# Patient Record
Sex: Male | Born: 1975 | Race: Black or African American | Hispanic: No | Marital: Married | Smoking: Never smoker
Health system: Southern US, Community
[De-identification: ages and names within clinical notes are randomized; demographics above are authoritative.]

## PROBLEM LIST (undated history)

## (undated) DIAGNOSIS — J309 Allergic rhinitis, unspecified: Secondary | ICD-10-CM

## (undated) DIAGNOSIS — M1990 Unspecified osteoarthritis, unspecified site: Secondary | ICD-10-CM

## (undated) DIAGNOSIS — E785 Hyperlipidemia, unspecified: Secondary | ICD-10-CM

## (undated) DIAGNOSIS — Z8659 Personal history of other mental and behavioral disorders: Secondary | ICD-10-CM

## (undated) DIAGNOSIS — M5412 Radiculopathy, cervical region: Secondary | ICD-10-CM

## (undated) DIAGNOSIS — G935 Compression of brain: Secondary | ICD-10-CM

## (undated) DIAGNOSIS — R002 Palpitations: Secondary | ICD-10-CM

## (undated) HISTORY — PX: TONSILLECTOMY AND ADENOIDECTOMY: SUR1326

## (undated) HISTORY — DX: Radiculopathy, cervical region: M54.12

## (undated) HISTORY — DX: Personal history of other mental and behavioral disorders: Z86.59

## (undated) HISTORY — DX: Compression of brain: G93.5

## (undated) HISTORY — DX: Hyperlipidemia, unspecified: E78.5

## (undated) HISTORY — DX: Palpitations: R00.2

## (undated) HISTORY — DX: Allergic rhinitis, unspecified: J30.9

## (undated) HISTORY — DX: Unspecified osteoarthritis, unspecified site: M19.90

---

## 1999-01-10 ENCOUNTER — Emergency Department (HOSPITAL_COMMUNITY): Admission: EM | Admit: 1999-01-10 | Discharge: 1999-01-10 | Payer: Self-pay | Admitting: Emergency Medicine

## 2005-11-16 DIAGNOSIS — R002 Palpitations: Secondary | ICD-10-CM

## 2005-11-16 HISTORY — DX: Palpitations: R00.2

## 2008-11-11 ENCOUNTER — Emergency Department (HOSPITAL_COMMUNITY): Admission: EM | Admit: 2008-11-11 | Discharge: 2008-11-11 | Payer: Self-pay | Admitting: Emergency Medicine

## 2008-12-19 ENCOUNTER — Encounter: Payer: Self-pay | Admitting: Internal Medicine

## 2008-12-26 ENCOUNTER — Encounter: Payer: Self-pay | Admitting: Internal Medicine

## 2009-01-18 ENCOUNTER — Encounter: Payer: Self-pay | Admitting: Internal Medicine

## 2009-02-20 ENCOUNTER — Ambulatory Visit: Payer: Self-pay | Admitting: Internal Medicine

## 2009-04-18 ENCOUNTER — Ambulatory Visit: Payer: Self-pay | Admitting: Internal Medicine

## 2009-04-18 DIAGNOSIS — F411 Generalized anxiety disorder: Secondary | ICD-10-CM | POA: Insufficient documentation

## 2009-05-01 ENCOUNTER — Encounter: Payer: Self-pay | Admitting: Internal Medicine

## 2009-05-21 ENCOUNTER — Telehealth (INDEPENDENT_AMBULATORY_CARE_PROVIDER_SITE_OTHER): Payer: Self-pay | Admitting: *Deleted

## 2009-05-31 ENCOUNTER — Telehealth: Payer: Self-pay | Admitting: Internal Medicine

## 2009-06-26 ENCOUNTER — Ambulatory Visit: Payer: Self-pay | Admitting: Internal Medicine

## 2009-06-26 DIAGNOSIS — R079 Chest pain, unspecified: Secondary | ICD-10-CM | POA: Insufficient documentation

## 2009-07-01 ENCOUNTER — Telehealth (INDEPENDENT_AMBULATORY_CARE_PROVIDER_SITE_OTHER): Payer: Self-pay | Admitting: *Deleted

## 2010-02-10 ENCOUNTER — Ambulatory Visit: Payer: Self-pay | Admitting: Internal Medicine

## 2010-02-10 DIAGNOSIS — J069 Acute upper respiratory infection, unspecified: Secondary | ICD-10-CM | POA: Insufficient documentation

## 2010-02-10 DIAGNOSIS — J309 Allergic rhinitis, unspecified: Secondary | ICD-10-CM | POA: Insufficient documentation

## 2010-02-10 DIAGNOSIS — J029 Acute pharyngitis, unspecified: Secondary | ICD-10-CM

## 2010-02-10 LAB — CONVERTED CEMR LAB: Rapid Strep: NEGATIVE

## 2010-12-16 NOTE — Assessment & Plan Note (Signed)
Summary: SORE THROAT/RH.......Marland Kitchen   Vital Signs:  Patient profile:   35 year old male Height:      70.5 inches Weight:      227 pounds BMI:     32.23 Temp:     99.7 degrees F BP sitting:   120 / 80  Vitals Entered By: Shary Decamp (February 10, 2010 3:34 PM) CC: ST x few days Comments pt has ? about flonase vs saline nasal spray Shary Decamp  February 10, 2010 3:42 PM    History of Present Illness: sick x 3 days  ST, weak using OTCs   also wonders if he needs to use Flonase long-term, this was prescribed by ENT for constant nasal congestion.  Seems to be working. He used to use afrin several times a day and he is not doing that at this point   Allergies: No Known Drug Allergies  Past History:  Past Medical History: cold sores aprox 2007: had palpitations, saw cards, got a ECHO, told he was ok Anxiety Allergic rhinitis  Past Surgical History: Reviewed history from 02/20/2009 and no changes required. surgery for adenoids  Review of Systems General:  low grade fever today . ENT:  some sinus congestion. Resp:  no chest congestion, some cough some sputum x 1 last night . GI:  some nausea yesterday, no V D.  Physical Exam  General:  alert and well-developed.   Head:  face symmetric, nontender to palpation Ears:  R ear normal and L ear normal.   Nose:  congested Mouth:  no redness or discharge Lungs:  clear to auscultation bilaterally Heart:  normal rate, regular rhythm, and no murmur.     Impression & Recommendations:  Problem # 1:  URI (ICD-465.9) see instructions  Problem # 2:  ALLERGIC RHINITIS (ICD-477.9) history of afrin  abuse saw ENT, was  prescribe Flonase, apparently working, wonders if he needs to take that long term Yes:  recommend Flonase daily for several months if not indefinitely His updated medication list for this problem includes:    Flonase 50 Mcg/act Susp (Fluticasone propionate) .Marland Kitchen... 2 sprays each nostril once daily  Problem # 3:  also  difficulty loosing weight counseled about diet, Weight Watchers?  Complete Medication List: 1)  Flonase 50 Mcg/act Susp (Fluticasone propionate) .... 2 sprays each nostril once daily 2)  Valtrex 500 Mg Tabs (Valacyclovir hcl) .... As directed for cold sores 3)  Alprazolam 0.25 Mg Tabs (Alprazolam) .Marland Kitchen.. 1po two times a day as needed  Other Orders: Rapid Strep (16109)  Patient Instructions: 1)  Get plenty of rest, drink lots of clear liquids, and use Tylenol  2)  Robitussin-DM 3)  Sudafed 30 mg (behind the counter)one by mouth every   6 hours as needed for congestion 4)  call if not better in a few days  Prescriptions: FLONASE 50 MCG/ACT SUSP (FLUTICASONE PROPIONATE) 2 sprays each nostril once daily  #1 x 6   Entered and Authorized by:   Nolon Rod. Paz MD   Signed by:   Nolon Rod. Paz MD on 02/10/2010   Method used:   Electronically to        [facility] [address] (retail)       [address]       [place], [place]  [id number]       Ph: [phone]       Fax: [phone]   RxID:   [id number]   Laboratory Results    Other Tests  Rapid  Strep: negative  Kit Test Internal QC: Positive   (Normal Range: Negative)

## 2011-04-06 ENCOUNTER — Other Ambulatory Visit: Payer: Self-pay | Admitting: Internal Medicine

## 2011-08-10 ENCOUNTER — Other Ambulatory Visit: Payer: Self-pay | Admitting: Internal Medicine

## 2011-08-10 NOTE — Telephone Encounter (Signed)
Done

## 2011-08-21 LAB — CBC
HCT: 46.7 % (ref 39.0–52.0)
Hemoglobin: 15.6 g/dL (ref 13.0–17.0)
RBC: 4.97 MIL/uL (ref 4.22–5.81)
RDW: 13.3 % (ref 11.5–15.5)
WBC: 4.1 10*3/uL (ref 4.0–10.5)

## 2011-08-21 LAB — URINALYSIS, ROUTINE W REFLEX MICROSCOPIC
Glucose, UA: NEGATIVE mg/dL
Hgb urine dipstick: NEGATIVE
Ketones, ur: NEGATIVE mg/dL
pH: 7 (ref 5.0–8.0)

## 2011-08-21 LAB — POCT I-STAT, CHEM 8
BUN: 14 mg/dL (ref 6–23)
Calcium, Ion: 1.15 mmol/L (ref 1.12–1.32)
Chloride: 104 mEq/L (ref 96–112)
Creatinine, Ser: 1.2 mg/dL (ref 0.4–1.5)
TCO2: 25 mmol/L (ref 0–100)

## 2011-08-21 LAB — DIFFERENTIAL
Basophils Absolute: 0 10*3/uL (ref 0.0–0.1)
Eosinophils Relative: 2 % (ref 0–5)
Lymphocytes Relative: 23 % (ref 12–46)
Lymphs Abs: 0.9 10*3/uL (ref 0.7–4.0)
Monocytes Absolute: 0.4 10*3/uL (ref 0.1–1.0)
Neutro Abs: 2.6 10*3/uL (ref 1.7–7.7)

## 2011-08-21 LAB — RAPID URINE DRUG SCREEN, HOSP PERFORMED
Amphetamines: NOT DETECTED
Barbiturates: NOT DETECTED
Benzodiazepines: NOT DETECTED
Cocaine: NOT DETECTED

## 2011-11-06 ENCOUNTER — Ambulatory Visit (INDEPENDENT_AMBULATORY_CARE_PROVIDER_SITE_OTHER): Payer: Managed Care, Other (non HMO)

## 2011-11-06 DIAGNOSIS — M659 Unspecified synovitis and tenosynovitis, unspecified site: Secondary | ICD-10-CM

## 2011-11-06 DIAGNOSIS — M79609 Pain in unspecified limb: Secondary | ICD-10-CM

## 2011-12-01 ENCOUNTER — Other Ambulatory Visit: Payer: Self-pay | Admitting: Internal Medicine

## 2011-12-01 NOTE — Telephone Encounter (Signed)
Done

## 2012-02-13 ENCOUNTER — Ambulatory Visit: Payer: Managed Care, Other (non HMO)

## 2012-02-13 ENCOUNTER — Ambulatory Visit (INDEPENDENT_AMBULATORY_CARE_PROVIDER_SITE_OTHER): Payer: Managed Care, Other (non HMO) | Admitting: Family Medicine

## 2012-02-13 DIAGNOSIS — M940 Chondrocostal junction syndrome [Tietze]: Secondary | ICD-10-CM

## 2012-02-13 DIAGNOSIS — R001 Bradycardia, unspecified: Secondary | ICD-10-CM

## 2012-02-13 DIAGNOSIS — S20219A Contusion of unspecified front wall of thorax, initial encounter: Secondary | ICD-10-CM

## 2012-02-13 NOTE — Progress Notes (Signed)
[facility]:  Office Visit  Chief Complaint:  Chief Complaint  Patient presents with  . Chest Injury    sore in upper left chest area  since wednesday    HPI: Brandon Tyler is a 36 y.o. male who complains of  3 day history of right sided chest pain after trauma. He was playing in a soccer league, fell and hit his chest on the ground. Denies any pleuritic CP, just stabbing pain with palpation. No other sxs, SOB, palpitations, N/V, abd pain, numbness, weakness tingling. Athletic but not avid runner. Workout 4 x a week. Denies history of  premature MI or arryhthymias in family.   History reviewed. No pertinent past medical history. Past Surgical History  Procedure Date  . Tonsillectomy and adenoidectomy    History   Social History  . Marital Status: Married    Spouse Name: N/A    Number of Children: N/A  . Years of Education: N/A   Social History Main Topics  . Smoking status: Never Smoker   . Smokeless tobacco: None  . Alcohol Use: Yes  . Drug Use: No  . Sexually Active: None   Other Topics Concern  . None   Social History Narrative  . None   Family History  Problem Relation Age of Onset  . Hyperlipidemia Maternal Grandmother    No Known Allergies Prior to Admission medications   Medication Sig Start Date End Date Taking? Authorizing Provider  meloxicam (MOBIC) 15 MG tablet Take 15 mg by mouth daily.   Yes Historical Provider, MD  fluticasone Aleda Grana) 50 MCG/ACT nasal spray place 2 sprays into each nostril once daily 12/01/11   Wanda Plump, MD     ROS: The patient denies fevers, chills, night sweats, unintentional weight loss palpitations, wheezing, dyspnea on exertion, nausea, vomiting, abdominal pain, dysuria, hematuria, melena, numbness, weakness, or tingling. + CP  All other systems have been reviewed and were otherwise negative with the exception of those mentioned in the HPI and as above.    PHYSICAL EXAM: Filed Vitals:   02/13/12 1002    BP: 101/66  Pulse: 47  Temp: 97.6 F (36.4 C)  Resp: 16   Filed Vitals:   02/13/12 1002  Height: 5\' 10"  (1.778 m)  Weight: 219 lb 12.8 oz (99.701 kg)   Body mass index is 31.54 kg/(m^2).  General: Alert, no acute distress HEENT:  Normocephalic, atraumatic, oropharynx patent.  Cardiovascular: Bradycardia,  Regular  rhythm, no rubs murmurs or gallops.  No Carotid bruits, radial pulse intact. No pedal edema.  Respiratory: Clear to auscultation bilaterally.  No wheezes, rales, or rhonchi.  No cyanosis, no use of accessory musculature GI: No organomegaly, abdomen is soft and non-tender, positive bowel sounds.  No masses. Skin: No rashes. Neurologic: Facial musculature symmetric. Psychiatric: Patient is appropriate throughout our interaction. Lymphatic: No cervical lymphadenopathy Musculoskeletal: Gait intact.   LABS: Results for orders placed in visit on 02/10/10  CONVERTED [facility] LAB      Component Value Range   Rapid Strep negative       EKG/XRAY:   Primary read interpreted by Dr. Conley Rolls at [facility]. CXR- No fx/no dislocation EKG sinus brady 46 bpm, no ST changes.    ASSESSMENT/PLAN: Encounter Diagnoses  Name Primary?  . Chest pain Yes  . Bradycardia    Chest wall contusion and costochondritis-take Meloxicam as directed Bradycardia with associated lightheadedness/dizziness during exercise-EKG BPM 46, in office pulse 44,47 respectively. From our records he has alwys had a  low pulse in the mid 50s however it has never been below 50 or in mid 40s. Refer to cardiology.     Cable Fearn PHUONG, DO 02/13/2012 10:49 AM

## 2012-06-13 ENCOUNTER — Other Ambulatory Visit: Payer: Self-pay | Admitting: Internal Medicine

## 2012-06-14 NOTE — Telephone Encounter (Signed)
Denied, haven't seen the patient in more than 3 years

## 2012-06-14 NOTE — Telephone Encounter (Signed)
Done

## 2012-06-14 NOTE — Telephone Encounter (Signed)
Pt has not been seen within a year. OK to refill? 

## 2012-06-20 NOTE — Telephone Encounter (Signed)
Pt left vm on triage line to advise that he was informed by pharmacy that he needs an apt however pt stated on vm that he was calling in reference to that per confused, please advise

## 2012-06-20 NOTE — Telephone Encounter (Signed)
Discussed with pt

## 2012-06-23 ENCOUNTER — Other Ambulatory Visit: Payer: Self-pay | Admitting: Internal Medicine

## 2012-06-23 NOTE — Telephone Encounter (Signed)
Pt has a future appt.

## 2012-06-23 NOTE — Telephone Encounter (Signed)
Pt called and made CPE appointment for first available in October. He is requesting rx for his flonase.

## 2012-07-21 ENCOUNTER — Encounter: Payer: Self-pay | Admitting: Internal Medicine

## 2012-08-29 ENCOUNTER — Encounter: Payer: Self-pay | Admitting: Internal Medicine

## 2012-08-30 ENCOUNTER — Encounter: Payer: Self-pay | Admitting: Internal Medicine

## 2012-11-25 ENCOUNTER — Encounter: Payer: Self-pay | Admitting: Internal Medicine

## 2012-12-02 ENCOUNTER — Ambulatory Visit: Payer: Managed Care, Other (non HMO) | Admitting: Family Medicine

## 2012-12-02 VITALS — BP 109/69 | HR 56 | Temp 98.0°F | Resp 16 | Ht 70.0 in | Wt 220.0 lb

## 2012-12-02 DIAGNOSIS — R109 Unspecified abdominal pain: Secondary | ICD-10-CM

## 2012-12-02 DIAGNOSIS — K219 Gastro-esophageal reflux disease without esophagitis: Secondary | ICD-10-CM

## 2012-12-02 LAB — COMPREHENSIVE METABOLIC PANEL
ALT: 24 U/L (ref 0–53)
Albumin: 4.6 g/dL (ref 3.5–5.2)
CO2: 25 mEq/L (ref 19–32)
Calcium: 9.5 mg/dL (ref 8.4–10.5)
Chloride: 103 mEq/L (ref 96–112)
Potassium: 4.5 mEq/L (ref 3.5–5.3)
Sodium: 138 mEq/L (ref 135–145)
Total Bilirubin: 0.6 mg/dL (ref 0.3–1.2)
Total Protein: 7.2 g/dL (ref 6.0–8.3)

## 2012-12-02 LAB — AMYLASE: Amylase: 76 U/L (ref 0–105)

## 2012-12-02 LAB — POCT CBC
HCT, POC: 49.9 % (ref 43.5–53.7)
Hemoglobin: 15.8 g/dL (ref 14.1–18.1)
MPV: 9 fL (ref 0–99.8)
POC Granulocyte: 2.7 (ref 2–6.9)
POC MID %: 8.2 %M (ref 0–12)
RBC: 5.28 M/uL (ref 4.69–6.13)

## 2012-12-02 MED ORDER — GI COCKTAIL ~~LOC~~: 30.0000 mL | Freq: Once | ORAL | Status: DC

## 2012-12-02 MED ORDER — SUCRALFATE 1 G PO TABS: 1.0000 g | ORAL_TABLET | Freq: Four times a day (QID) | ORAL | Status: DC

## 2012-12-02 NOTE — Progress Notes (Signed)
[facility] [address], [place] [address] [phone]  Date:  12/02/2012   Name:  Brandon Tyler   DOB:  [birth year]   MRN:  [id number]  PCP:  Willow Ora, MD    Chief Complaint: Abdominal Pain   History of Present Illness:  Brandon Tyler is a 37 y.o. very pleasant male patient who presents with the following:  He has noted a history of abdominal pain with spicy foods- however this usually resolves with time.  His pain pattern is different this time- he awoke with abdomianl pain on Monday, and then had diarrhea for a day.  His BMs are now back to normal.  The pain has come and gone since then- today is Friday.  His worst pain is in the epigastric area.    He has not noted any nausea or vomiting, no blood in his stools.   The pain is "like a burning."  The pain is not as severe now as it has been but it is still present.    He has had trouble with spicy foods since he was a child.  They tend to cause GI upset and diarrhea.   He did eat spicy foods the day prior to onset of this episode He has tried a milk of magnesia OTC treatment, and is drinking milk as well- no other treatments attempted.    He does not use ibuprofen or other NSAIDs regulaly He is generally healthy He had a T and A in middle school. Otherwise no history of surgery  He is married,  A non- smoker  Eating does not generally make him worse- only worse if he eats spicy foods  Patient Active Problem List  Diagnosis  . ANXIETY  . SORE THROAT  . URI  . ALLERGIC RHINITIS  . CHEST PAIN    History reviewed. No pertinent past medical history.  Past Surgical History  Procedure Date  . Tonsillectomy and adenoidectomy     History  Substance Use Topics  . Smoking status: Never Smoker   . Smokeless tobacco: Not on file  . Alcohol Use: Yes    Family History  Problem Relation Age of Onset  . Hyperlipidemia Maternal Grandmother     No Known Allergies  Medication list has been  reviewed and updated.  Current Outpatient Prescriptions on File Prior to Visit  Medication Sig Dispense Refill  . fluticasone (FLONASE) 50 MCG/ACT nasal spray place 2 sprays into each nostril once daily  16 g  0  . meloxicam (MOBIC) 15 MG tablet Take 15 mg by mouth daily.        Review of Systems:  As per HPI- otherwise negative.   Physical Examination: Filed Vitals:   12/02/12 0924  BP: 109/69  Pulse: 56  Temp: 98 F (36.7 C)  Resp: 16   Filed Vitals:   12/02/12 0924  Height: 5\' 10"  (1.778 m)  Weight: 220 lb (99.791 kg)   Body mass index is 31.57 kg/(m^2). Ideal Body Weight: Weight in (lb) to have BMI = 25: 173.9   GEN: WDWN, NAD, Non-toxic, A & O x 3, looks well HEENT: Atraumatic, Normocephalic. Neck supple. No masses, No LAD. Bilateral TM wnl, oropharynx normal.  PEERL,EOMI.  g Ears and Nose: No external deformity. CV: RRR, No M/G/R. No JVD. No thrill. No extra heart sounds. PULM: CTA B, no wheezes, crackles, rhonchi. No retractions. No resp. distress. No accessory muscle use. ABD: S, ND, +BS. No rebound. No HSM.  He has minimal "tighteness" upon abdominal palpation over the entire abdomen, no particular area.  Negative murphy's sign.  EXTR: No c/c/e NEURO Normal gait.  PSYCH: Normally interactive. Conversant. Not depressed or anxious appearing.  Calm demeanor.   Gave GI cocktail: did not make much of a difference but his symptoms are not active at this time.    Results for orders placed in visit on 12/02/12  POCT CBC      Component Value Range   WBC 4.3 (*) 4.6 - 10.2 K/uL   Lymph, poc 1.2  0.6 - 3.4   POC LYMPH PERCENT 28.4  10 - 50 %L   MID (cbc) 0.4  0 - 0.9   POC MID % 8.2  0 - 12 %M   POC Granulocyte 2.7  2 - 6.9   Granulocyte percent 63.4  37 - 80 %G   RBC 5.28  4.69 - 6.13 M/uL   Hemoglobin 15.8  14.1 - 18.1 g/dL   HCT, POC 16.1  09.6 - 53.7 %   MCV 94.5  80 - 97 fL   MCH, POC 29.9  27 - 31.2 pg   MCHC 31.7 (*) 31.8 - 35.4 g/dL   RDW, POC 04.5      Platelet Count, POC 238  142 - 424 K/uL   MPV 9.0  0 - 99.8 fL   Assessment and Plan: 1. Abdominal  pain, other specified site  POCT CBC, Comprehensive metabolic panel, Amylase, Lipase, gi cocktail (Maalox,Lidocaine,Donnatal)  2. GERD (gastroesophageal reflux disease)  sucralfate (CARAFATE) 1 G tablet   Suspect Tru has GERD/ gastritis. Will treat with carafate and OTC PPI.  Await other labs as above.  Cautioned him to follow- up if not better in the next few days, sooner if worse.  Discussed a CT scan and offered to perform one today- however after discussion of risks and benefits Landan decided to defer this unless he gets worse.  He understands that he does not have any of the typical signs of appendicitis including tachycardia, fever, vomiting, leukocytosis or RLQ pain.    Abbe Amsterdam, MD

## 2012-12-02 NOTE — Patient Instructions (Addendum)
Avoid any NSAID medications such as ibuprofen or naproxen.  Also avoid aspirin. If you start to feel worse or have persistent pain please seek care. Avoid spicy or heavy foods, and avoid acidic foods. Eat a bland diet and try to keep something on your stomach.  Use the carafate 4x a day, prior to meals and bed.  Buy some prilosec OTC and take it daily for a couple of weeks.

## 2012-12-03 ENCOUNTER — Encounter: Payer: Self-pay | Admitting: Family Medicine

## 2013-01-26 ENCOUNTER — Ambulatory Visit (INDEPENDENT_AMBULATORY_CARE_PROVIDER_SITE_OTHER): Payer: Managed Care, Other (non HMO) | Admitting: Internal Medicine

## 2013-01-26 ENCOUNTER — Encounter: Payer: Self-pay | Admitting: Internal Medicine

## 2013-01-26 VITALS — BP 90/58 | HR 51 | Temp 98.1°F | Ht 71.5 in | Wt 230.0 lb

## 2013-01-26 DIAGNOSIS — D229 Melanocytic nevi, unspecified: Secondary | ICD-10-CM

## 2013-01-26 DIAGNOSIS — Z0000 Encounter for general adult medical examination without abnormal findings: Secondary | ICD-10-CM

## 2013-01-26 MED ORDER — NYSTATIN-TRIAMCINOLONE 100000-0.1 UNIT/GM-% EX OINT: TOPICAL_OINTMENT | Freq: Two times a day (BID) | CUTANEOUS | Status: DC

## 2013-01-26 MED ORDER — FLUTICASONE PROPIONATE 50 MCG/ACT NA SUSP: 2.0000 | Freq: Every day | NASAL | Status: DC

## 2013-01-26 NOTE — Assessment & Plan Note (Addendum)
Tdap not available today , rec to call in 2 weeks Cont healthy life style Supplements: agree w/ MVI , some fish oil and protein shakes (temporarily), rec against "testosterone busters" or "fat burners" Labs STE Skin: Rash-- trial w/ mycolog  Moles-- to derm

## 2013-01-26 NOTE — Patient Instructions (Addendum)
come back fasting: FLP CMP CBC TSH--- dx v70 --- Call in two weeks to get a tetanus shot --- Next visit : 1 year

## 2013-01-26 NOTE — Progress Notes (Signed)
  Subjective:    Patient ID: Brandon Tyler, male    DOB: 05/21/1976, 37 y.o.   MRN: [id number]  HPI Complete physical exam, feeling great, lifestyle has improved significantly. He has a number of questions about OTC supplements, see assessment and plan. Has a rash on and off. Also has several moles @ the  right temple that itch from time to time, like them removed.  Past Medical History  Diagnosis Date  . Palpitations 2007     saw cards, got a ECHO, told he was ok  . H/O anxiety disorder   . Allergic rhinitis    Past Surgical History  Procedure Laterality Date  . Tonsillectomy and adenoidectomy     History   Social History  . Marital Status: Married    Spouse Name: N/A    Number of Children: 2  . Years of Education: N/A   Occupational History  . service specialist [employer]   Social History Main Topics  . Smoking status: Never Smoker   . Smokeless tobacco: Never Used  . Alcohol Use: Yes     Comment: rarely   . Drug Use: No  . Sexually Active: Not on file   Other Topics Concern  . Not on file   Social History Narrative   Diet: very healthy lately   Exercise: almost daily ,very active   Family History  Problem Relation Age of Onset  . Hyperlipidemia Maternal Grandmother   . Diabetes Other     distant relative   . CAD Neg Hx   . Colon cancer Neg Hx   . Prostate cancer Other     GF  . Pancreatic cancer Other     GM, uncle     Review of Systems  Respiratory: Negative for cough, chest tightness and wheezing.   Cardiovascular: Negative for chest pain and leg swelling.  Gastrointestinal: Negative for nausea, diarrhea and blood in stool.  Genitourinary: Negative for dysuria, hematuria and difficulty urinating.  Psychiatric/Behavioral:       Admits to some anxiety.       Objective:   Physical Exam General -- alert, well-developed    Neck --no thyromegaly , normal carotid pulse Lungs -- normal respiratory effort, no intercostal retractions, no  accessory muscle use, and normal breath sounds.   Heart-- normal rate, regular rhythm, no murmur, and no gallop.   Abdomen--soft, non-tender, no distention, no masses, no HSM, no guarding, and no rigidity.   Extremities-- no pretibial edema bilaterally Skin: He showed me the "on and off rash" : is 1 cm ring slightly scaly at the borders R arm. He also has several, 1-2 mm brown moles in the right temple Neurologic-- alert & oriented X3 and strength normal in all extremities. Psych-- Cognition and judgment appear intact. Alert and cooperative with normal attention span and concentration.  not anxious appearing and not depressed appearing.      Assessment & Plan:

## 2013-02-01 ENCOUNTER — Other Ambulatory Visit: Payer: Managed Care, Other (non HMO)

## 2013-04-10 ENCOUNTER — Telehealth (HOSPITAL_COMMUNITY): Payer: Self-pay | Admitting: Emergency Medicine

## 2013-04-10 ENCOUNTER — Emergency Department (HOSPITAL_COMMUNITY): Payer: Managed Care, Other (non HMO)

## 2013-04-10 ENCOUNTER — Encounter (HOSPITAL_COMMUNITY): Payer: Self-pay | Admitting: *Deleted

## 2013-04-10 ENCOUNTER — Emergency Department (HOSPITAL_COMMUNITY)
Admission: EM | Admit: 2013-04-10 | Discharge: 2013-04-10 | Disposition: A | Discharge status: Home / Self Care | Payer: Managed Care, Other (non HMO) | Attending: Emergency Medicine | Admitting: Emergency Medicine

## 2013-04-10 DIAGNOSIS — N201 Calculus of ureter: Secondary | ICD-10-CM | POA: Insufficient documentation

## 2013-04-10 DIAGNOSIS — Z8659 Personal history of other mental and behavioral disorders: Secondary | ICD-10-CM | POA: Insufficient documentation

## 2013-04-10 DIAGNOSIS — R112 Nausea with vomiting, unspecified: Secondary | ICD-10-CM | POA: Insufficient documentation

## 2013-04-10 DIAGNOSIS — N289 Disorder of kidney and ureter, unspecified: Secondary | ICD-10-CM

## 2013-04-10 DIAGNOSIS — IMO0002 Reserved for concepts with insufficient information to code with codable children: Secondary | ICD-10-CM | POA: Insufficient documentation

## 2013-04-10 DIAGNOSIS — N132 Hydronephrosis with renal and ureteral calculous obstruction: Secondary | ICD-10-CM

## 2013-04-10 DIAGNOSIS — N1330 Unspecified hydronephrosis: Secondary | ICD-10-CM | POA: Insufficient documentation

## 2013-04-10 LAB — CBC WITH DIFFERENTIAL/PLATELET
Basophils Relative: 1 % (ref 0–1)
Eosinophils Absolute: 0.1 10*3/uL (ref 0.0–0.7)
Hemoglobin: 15.4 g/dL (ref 13.0–17.0)
MCH: 31 pg (ref 26.0–34.0)
MCHC: 35.6 g/dL (ref 30.0–36.0)
Monocytes Relative: 6 % (ref 3–12)
Neutrophils Relative %: 58 % (ref 43–77)
RDW: 12.7 % (ref 11.5–15.5)

## 2013-04-10 LAB — URINE MICROSCOPIC-ADD ON

## 2013-04-10 LAB — LIPASE, BLOOD: Lipase: 45 U/L (ref 11–59)

## 2013-04-10 LAB — URINALYSIS, ROUTINE W REFLEX MICROSCOPIC
Glucose, UA: NEGATIVE mg/dL
Leukocytes, UA: NEGATIVE
pH: 5.5 (ref 5.0–8.0)

## 2013-04-10 LAB — COMPREHENSIVE METABOLIC PANEL
Albumin: 4.2 g/dL (ref 3.5–5.2)
Alkaline Phosphatase: 40 U/L (ref 39–117)
BUN: 19 mg/dL (ref 6–23)
Creatinine, Ser: 1.54 mg/dL — ABNORMAL HIGH (ref 0.50–1.35)
Potassium: 4 mEq/L (ref 3.5–5.1)
Total Protein: 7.4 g/dL (ref 6.0–8.3)

## 2013-04-10 MED ORDER — HYDROCODONE-ACETAMINOPHEN 5-325 MG PO TABS: 1.0000 | ORAL_TABLET | ORAL | Status: DC | PRN

## 2013-04-10 MED ORDER — ONDANSETRON HCL 4 MG PO TABS: 4.0000 mg | ORAL_TABLET | Freq: Four times a day (QID) | ORAL | Status: DC

## 2013-04-10 NOTE — ED Notes (Signed)
Pt c/o constant RLQ abdominal pain with N/V x 1 that woke him at 0400 this morning. Last BM today. Denies diarrhea

## 2013-04-10 NOTE — ED Provider Notes (Signed)
Medical screening examination/treatment/procedure(s) were performed by non-physician practitioner and as supervising physician I was immediately available for consultation/collaboration.  Dione Booze, MD 04/10/13 [phone]

## 2013-04-10 NOTE — ED Provider Notes (Signed)
History     CSN: [id number]  Arrival date & time 04/10/13  1191   First MD Initiated Contact with Patient 04/10/13 [phone]      Chief Complaint  Patient presents with  . Abdominal Pain    (Consider location/radiation/quality/duration/timing/severity/associated sxs/prior treatment) Patient is a 37 y.o. male presenting with abdominal pain. The history is provided by the patient. No language interpreter was used.  Abdominal Pain This is a new problem. The current episode started yesterday. The problem occurs intermittently. Associated symptoms include abdominal pain, nausea and vomiting. Pertinent negatives include no chest pain, diaphoresis, fever or urinary symptoms. Associated symptoms comments: Right sided abdominal pain since yesterday with nausea and vomiting today. No known fever. He denies genital pain or swelling. He does state his urine was "brown" yesterday. No history of kidney stones. He denies flank pain..    Past Medical History  Diagnosis Date  . Palpitations 2007     saw cards, got a ECHO, told he was ok  . H/O anxiety disorder   . Allergic rhinitis     Past Surgical History  Procedure Laterality Date  . Tonsillectomy and adenoidectomy      Family History  Problem Relation Age of Onset  . Hyperlipidemia Maternal Grandmother   . Diabetes Other     distant relative   . CAD Neg Hx   . Colon cancer Neg Hx   . Prostate cancer Other     GF  . Pancreatic cancer Other     GM, uncle    History  Substance Use Topics  . Smoking status: Never Smoker   . Smokeless tobacco: Never Used  . Alcohol Use: Yes     Comment: rarely       Review of Systems  Constitutional: Negative for fever and diaphoresis.  Respiratory: Negative for shortness of breath.   Cardiovascular: Negative for chest pain.  Gastrointestinal: Positive for nausea, vomiting and abdominal pain.  Genitourinary: Negative for dysuria, flank pain, scrotal swelling and testicular pain.       See HPI.     Allergies  Review of patient's allergies indicates no known allergies.  Home Medications   Current Outpatient Rx  Name  Route  Sig  Dispense  Refill  . fluticasone (FLONASE) 50 MCG/ACT nasal spray   Nasal   Place 2 sprays into the nose daily.   16 g   12     BP 119/48  Pulse 48  Temp(Src) 97.5 F (36.4 C) (Oral)  Resp 18  SpO2 100%  Physical Exam  Constitutional: He is oriented to person, place, and time. He appears well-developed and well-nourished. No distress.  HENT:  Head: Normocephalic.  Abdominal: Soft. Bowel sounds are normal. There is no tenderness. There is no rebound and no guarding.  Genitourinary:  No CVA tenderness.   Neurological: He is alert and oriented to person, place, and time.  Skin: Skin is warm and dry.  Psychiatric: He has a normal mood and affect.    ED Course  Procedures (including critical care time)  Labs Reviewed  COMPREHENSIVE METABOLIC PANEL - Abnormal; Notable for the following:    Glucose, Bld 153 (*)    Creatinine, Ser 1.54 (*)    AST 39 (*)    GFR calc non Af Amer 57 (*)    GFR calc Af Amer 66 (*)    All other components within normal limits  URINALYSIS, ROUTINE W REFLEX MICROSCOPIC - Abnormal; Notable for the following:    Specific Gravity,  Urine 1.034 (*)    Hgb urine dipstick LARGE (*)    All other components within normal limits  URINE MICROSCOPIC-ADD ON - Abnormal; Notable for the following:    Bacteria, UA FEW (*)    All other components within normal limits  URINE CULTURE  CBC WITH DIFFERENTIAL  LIPASE, BLOOD   Ct Abdomen Pelvis Wo Contrast  04/10/2013   *RADIOLOGY REPORT*  Clinical Data: Right-sided abdominal pain.  Hematuria.  CT ABDOMEN AND PELVIS WITHOUT CONTRAST  Technique:  Multidetector CT imaging of the abdomen and pelvis was performed following the standard protocol without intravenous contrast.  Comparison: None.  Findings: Lung bases are clear.  No pleural or pericardial fluid.  The liver has a normal  appearance without focal lesions or biliary ductal dilatation.  No calcified gallstones.  The spleen is normal. The pancreas is normal.  The adrenal glands are normal.  The aorta and IVC are normal.  The appendix is normal.  No other bowel pathology.  No fluid in the pelvis.  The right kidney shows swelling.  There is hydroureteronephrosis with the right ureter being dilated to the level of L3.  There is a seven by 3 mm stone in the ureter at that location responsible for the obstruction.  There is a 2 mm nonobstructing stone in the upper pole of the right kidney.  The left kidney shows a few tiny nonobstructing calculi, the largest in the upper pole measuring 2.5 mm.  There is an exophytic 2.8 cm lesion projecting ventrally from the mid to upper portion. This is well circumscribed and shows water density measurements. This is likely to represent a benign cyst.  There is some premature calcification of the iliac arteries.  No stone in the bladder.  Prostate gland and seminal vesicles appear unremarkable.  There is a phlebolith in the right pelvis.  IMPRESSION: Hydroureteronephrosis on the right with the ureter dilated to the level of the L3 vertebral body.  Few other punctate nonobstructing renal calculi.  2.8 cm left renal cyst.   Original Report Authenticated By: Paulina Fusi, M.D.     No diagnosis found. 1. Ureteral stone 2. Hydronephrosis 3. Renal insufficiency     MDM  He remains pain-free in the department. No further vomiting. CT shows hydronephrosis on right with a 7x6mm stone at the L3 level. Creatinine slightly elevated at 1.51 (1.13 4 months ago). Discussed with Dr. Sherron Monday, urology, who will see patient in office tomorrow to arrange further treatment.         Arnoldo Hooker, PA-C 04/10/13 [phone]

## 2013-04-10 NOTE — ED Notes (Signed)
Patient back from CT.

## 2013-04-11 ENCOUNTER — Telehealth (HOSPITAL_COMMUNITY): Payer: Self-pay | Admitting: Emergency Medicine

## 2013-04-11 LAB — URINE CULTURE
Colony Count: NO GROWTH
Culture: NO GROWTH

## 2013-08-20 IMAGING — CR DG CHEST 2V
2 series · 2 of 2 positions shown · non-contrast
Comparison: No priors.

CLINICAL DATA: History of fall complaining of chest pain.

CHEST - 2 VIEW

[PA]
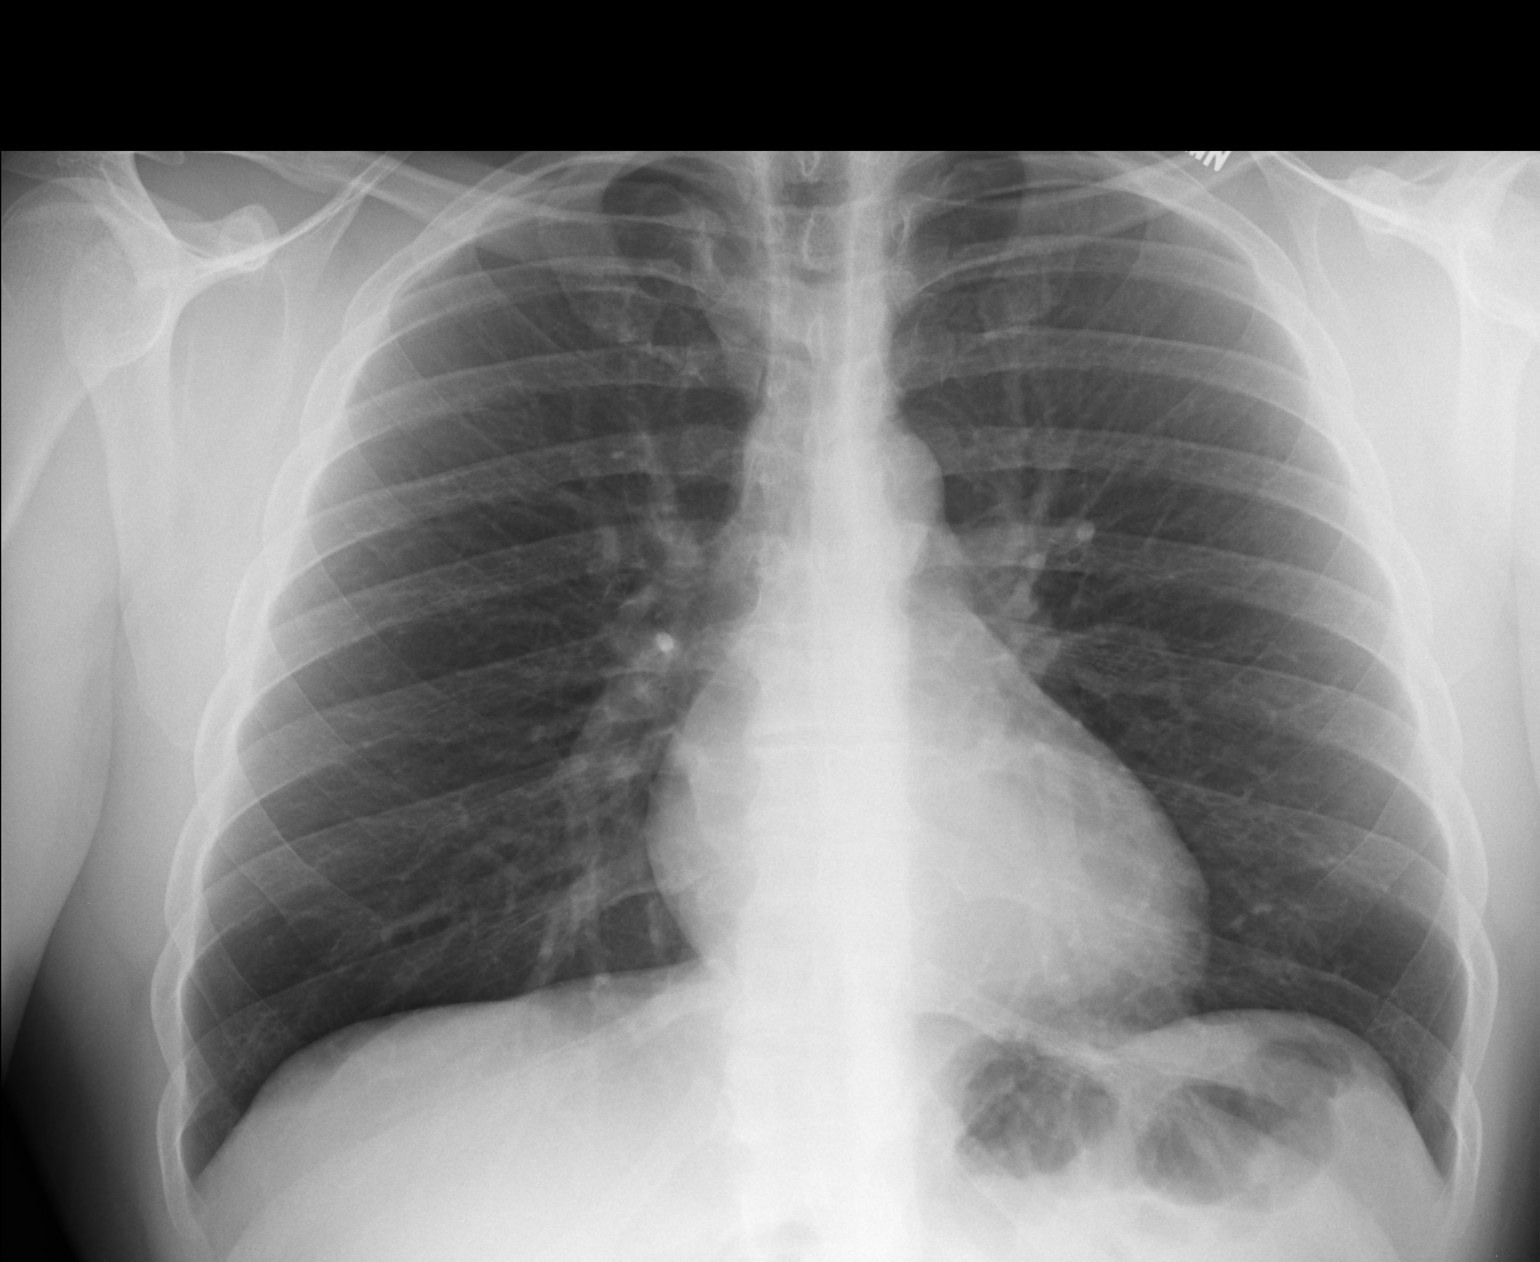

[lateral]
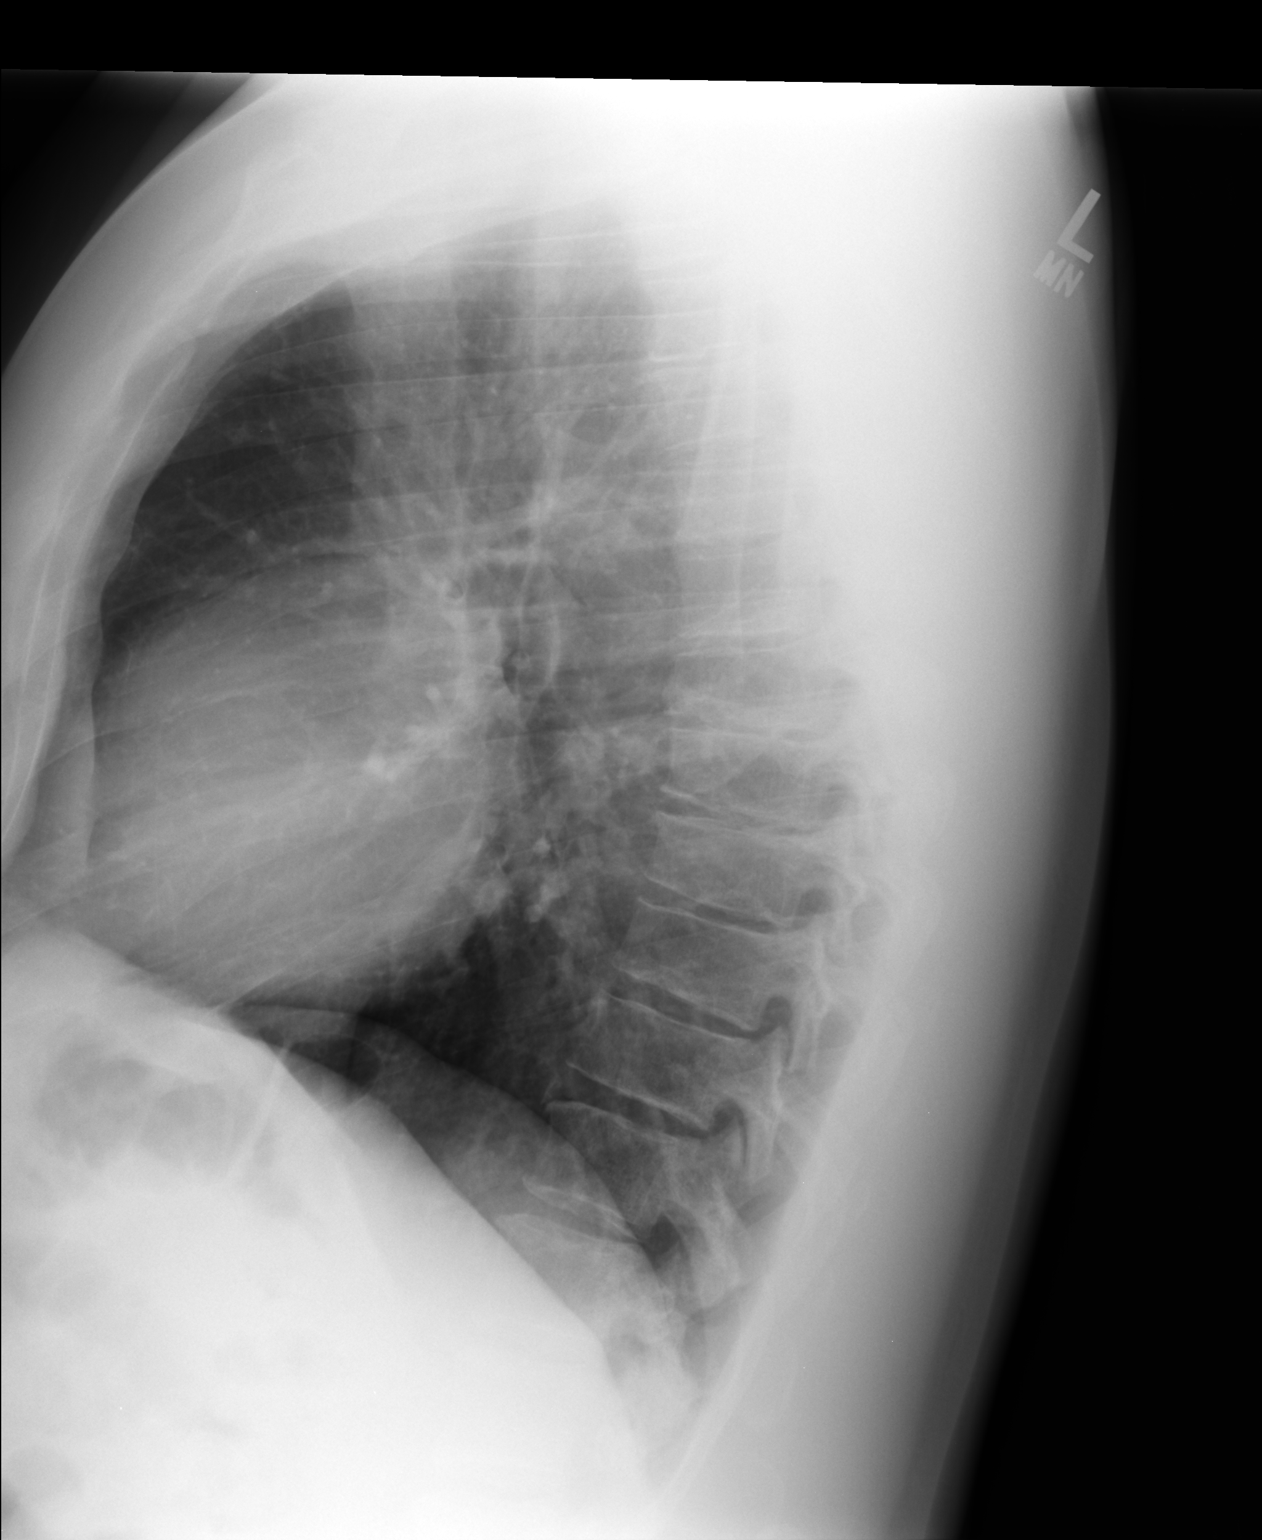

[2 of 2 positions shown; findings below may reference images not displayed]

FINDINGS: Lung volumes are normal.  No consolidative airspace
disease.  No pleural effusions.  No pneumothorax.  No pulmonary
nodule or mass noted.  Pulmonary vasculature and the
cardiomediastinal silhouette are within normal limits.   No
displaced rib fractures identified.
IMPRESSION: 1. No radiographic evidence of acute cardiopulmonary disease.

## 2014-03-26 ENCOUNTER — Ambulatory Visit (INDEPENDENT_AMBULATORY_CARE_PROVIDER_SITE_OTHER): Payer: Managed Care, Other (non HMO) | Admitting: Physician Assistant

## 2014-03-26 VITALS — BP 110/68 | HR 52 | Temp 98.8°F | Resp 16 | Ht 70.5 in | Wt 235.0 lb

## 2014-03-26 DIAGNOSIS — M25569 Pain in unspecified knee: Secondary | ICD-10-CM

## 2014-03-26 MED ORDER — FLUTICASONE PROPIONATE 50 MCG/ACT NA SUSP: 2.0000 | Freq: Every day | NASAL | Status: DC

## 2014-03-26 MED ORDER — MELOXICAM 15 MG PO TABS: 15.0000 mg | ORAL_TABLET | Freq: Every day | ORAL | Status: DC

## 2014-03-26 NOTE — Patient Instructions (Signed)
Take the meloxicam (Mobic) once daily with food for the next 2 weeks  DO NOT take the creatine supplement while you are on this medication, as this increases the risk of damage to your kidneys  REST the knee as much as possible over the next week - particularly no running or jumping  Ice the knee 2-3 times per day for about 15-20 minutes at a time  Elevate the knee when possible  If your knee is worsening or not improving in 2 weeks, I will put in a referral to see an orthopedist   RICE: Routine Care for Injuries The routine care of many injuries includes Rest, Ice, Compression, and Elevation (RICE). HOME CARE INSTRUCTIONS  Rest is needed to allow your body to heal. Routine activities can usually be resumed when comfortable. Injured tendons and bones can take up to 6 weeks to heal. Tendons are the cord-like structures that attach muscle to bone.  Ice following an injury helps keep the swelling down and reduces pain.  Put ice in a plastic bag.  Place a towel between your skin and the bag.  Leave the ice on for 15-20 minutes, 03-04 times a day. Do this while awake, for the first 24 to 48 hours. After that, continue as directed by your caregiver.  Compression helps keep swelling down. It also gives support and helps with discomfort. If an elastic bandage has been applied, it should be removed and reapplied every 3 to 4 hours. It should not be applied tightly, but firmly enough to keep swelling down. Watch fingers or toes for swelling, bluish discoloration, coldness, numbness, or excessive pain. If any of these problems occur, remove the bandage and reapply loosely. Contact your caregiver if these problems continue.  Elevation helps reduce swelling and decreases pain. With extremities, such as the arms, hands, legs, and feet, the injured area should be placed near or above the level of the heart, if possible. SEEK IMMEDIATE MEDICAL CARE IF:  You have persistent pain and swelling.  You  develop redness, numbness, or unexpected weakness.  Your symptoms are getting worse rather than improving after several days. These symptoms may indicate that further evaluation or further X-rays are needed. Sometimes, X-rays may not show a small broken bone (fracture) until 1 week or 10 days later. Make a follow-up appointment with your caregiver. Ask when your X-ray results will be ready. Make sure you get your X-ray results. Document Released: 02/14/2001 Document Revised: 01/25/2012 Document Reviewed: 04/03/2011 [facility] Patient Information 2014 [place], [place].

## 2014-03-26 NOTE — Progress Notes (Signed)
   Subjective:    Patient ID: Brandon Tyler, male    DOB: [birth year], 38 y.o.   MRN: [id number]  HPI     Brandon Tyler is a very pleasant 38 yr old male here with concern for LEFT knee pain.  Reports the knee is always popping, has been bothersome for a long time now.  About 2 months ago pt was running a lot, had increased pain, esp with stairs.  Felt like knee was giving out.  This past week, the knee was very painful.  Pain was constant, mostly at lateral joint line, occ medial joint line as well.  Feeling a little better today.  Pt is very active - basketball on Tuesday, golf on Wednesdays, bowling tonight, yard work, chasing after kids.  "Feels like I need to pop my knee."  Has not rested the knee.  Has not taken anything for pain.  Pt does take lots of herbal supplements including creatine and "nitro-oxide" (?)   Review of Systems  Constitutional: Negative.   Respiratory: Negative.   Cardiovascular: Negative.   Musculoskeletal: Positive for arthralgias.  Skin: Negative.   Neurological: Negative for weakness and numbness.       Objective:   Physical Exam  Vitals reviewed. Constitutional: He is oriented to person, place, and time. He appears well-developed and well-nourished. No distress.  HENT:  Head: Normocephalic and atraumatic.  Eyes: Conjunctivae are normal. No scleral icterus.  Cardiovascular: Intact distal pulses.   Pulmonary/Chest: Effort normal.  Musculoskeletal:       Right knee: Normal.       Left knee: He exhibits normal range of motion, no swelling, no effusion, no ecchymosis, no deformity, no erythema, normal alignment, no LCL laxity, normal patellar mobility, normal meniscus and no MCL laxity. Tenderness found. Medial joint line and lateral joint line tenderness noted. No patellar tendon tenderness noted.  Negative anterior drawer; Negative McMurray's; no laxity with varus or valgus stress  Neurological: He is alert and oriented to person, place, and time.  Skin: Skin is  warm and dry.  Psychiatric: He has a normal mood and affect. His behavior is normal.        Assessment & Plan:  Knee pain - Plan: meloxicam (MOBIC) 15 MG tablet   Brandon Tyler is a very pleasant 38 yr old male here with ongoing LEFT knee pain.  No trauma to the knee.  Pt is very active, has not rested at all.  Has not tried antiinflammatories.  Exam reveals tenderness primarily at the lateral joint line.  Provocative testing is all negative today.  ?meniscal injury given sensations of catching, giving out.  Will start mobic once daily x 2 wks (pt to hold creatine while taking mobic.)  Discussed the importance of resting the knee - pt very disappointed to hear this.  I would like him to stay out of sports for 1 wk, but I'm not sure that he will comply with this.  He is to ice and elevate when possible.  If worsening or no improvement in 2 wks, will refer to ortho or sports med  Pt to call or RTC if worsening or not improving  E. Natividad Brood MHS, PA-C [facility] 5/11/20157:34 PM

## 2014-07-03 ENCOUNTER — Telehealth: Payer: Self-pay | Admitting: Internal Medicine

## 2014-07-03 NOTE — Telephone Encounter (Signed)
Caller name: Brandon Tyler  Relation to pt: self  Call back number: [phone]  Reason for call: pt schedule CPE for 10/23/14 and would like to discuss testerone levels in need of clinical  advice.

## 2014-07-03 NOTE — Telephone Encounter (Signed)
Please Advise

## 2014-07-03 NOTE — Telephone Encounter (Signed)
We can discuss at time of CPX otherwise schedule a sooner appointment to discussed specifically testosterone issues

## 2014-07-03 NOTE — Telephone Encounter (Signed)
Please advise 

## 2014-07-03 NOTE — Telephone Encounter (Signed)
LMOM for Pt to return call. Pt has CPE scheduled for 10/23/2014.

## 2014-10-23 ENCOUNTER — Encounter: Payer: Managed Care, Other (non HMO) | Admitting: Internal Medicine

## 2014-12-31 ENCOUNTER — Encounter: Payer: Managed Care, Other (non HMO) | Admitting: Internal Medicine

## 2015-02-27 ENCOUNTER — Ambulatory Visit (INDEPENDENT_AMBULATORY_CARE_PROVIDER_SITE_OTHER): Payer: BLUE CROSS/BLUE SHIELD | Admitting: Internal Medicine

## 2015-02-27 ENCOUNTER — Encounter: Payer: Self-pay | Admitting: Internal Medicine

## 2015-02-27 VITALS — BP 126/78 | HR 60 | Temp 98.0°F | Ht 70.5 in | Wt 235.2 lb

## 2015-02-27 DIAGNOSIS — R059 Cough, unspecified: Secondary | ICD-10-CM

## 2015-02-27 DIAGNOSIS — R05 Cough: Secondary | ICD-10-CM

## 2015-02-27 MED ORDER — AZITHROMYCIN 250 MG PO TABS: ORAL_TABLET | ORAL | Status: DC

## 2015-02-27 NOTE — Progress Notes (Signed)
Pre visit review using our clinic review tool, if applicable. No additional management support is needed unless otherwise documented below in the visit note. 

## 2015-02-27 NOTE — Progress Notes (Signed)
Subjective:    Patient ID: Brandon Tyler, male    DOB: 1976-02-21, 39 y.o.   MRN: [id number]  DOS:  02/27/2015 Type of visit - description : acute Interval history: Symptoms started almost 2 months ago: Developed URI with cough, sore throat, weakness. He took OTCs, he got better but he has a lingering cough on and off, sometimes intense. Occasionally has a sore throat [facility], was prescribed conservative treatment, not feeling any better. Interestingly, sometimes the cough decreases when he eats.   Review of Systems Denies fever or chills. Denies sneezing, itchy eyes or nose. No postnasal dripping No GERD symptoms except one time had heartburn for 2 days after drinking orange juice but again otherwise no symptoms. No wheezing, very little amount of clear sputum sometimes.  Past Medical History  Diagnosis Date  . Palpitations 2007     saw cards, got a ECHO, told he was ok  . H/O anxiety disorder   . Allergic rhinitis   . Arthritis     Past Surgical History  Procedure Laterality Date  . Tonsillectomy and adenoidectomy      History   Social History  . Marital Status: Married    Spouse Name: N/A  . Number of Children: 2  . 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 Smoking status: Never Smoker   . Smokeless tobacco: Never Used  . Alcohol Use: Yes     Comment: rarely   . Drug Use: No  . Sexual Activity: Not on file   Other Topics Concern  . Not on file   Social History Narrative   Diet: very healthy lately   Exercise: almost daily ,very active        Medication List       This list is accurate as of: 02/27/15 11:59 PM.  Always use your most recent med list.               ALPRAZolam 0.25 MG tablet  Commonly known as:  XANAX  Take 0.25 mg by mouth at bedtime as needed for anxiety.     azithromycin 250 MG tablet  Commonly known as:  ZITHROMAX Z-PAK    2 tabs a day the first day, then 1 tab a day x 4 days     fluticasone 50 MCG/ACT nasal spray  Commonly known as:  FLONASE  Place 2 sprays into both nostrils daily.     valACYclovir 500 MG tablet  Commonly known as:  VALTREX  Take 500 mg by mouth 2 (two) times daily. FOR FEVER BLISTERS           Objective:   Physical Exam BP 126/78 mmHg  Pulse 60  Temp(Src) 98 F (36.7 C) (Oral)  Ht 5' 10.5" (1.791 m)  Wt 235 lb 4 oz (106.709 kg)  BMI 33.27 kg/m2  SpO2 98% General:   Well developed, well nourished . NAD.  HEENT:  Normocephalic . Face symmetric, atraumatic; sinuses no TTP Eyes: Conjunctiva: No redness or discharge Ears: Right ear: Canal normal, TM normal Left ear: Canal normal, TM normal Nose: not congested Throat-mouth:  no redness, discharge. Uvula midline. Lungs:  CTA B Normal respiratory effort, no intercostal retractions, no accessory muscle use. Heart: RRR,  no murmur.  Muscle skeletal: no pretibial edema bilaterally  Skin: Not pale. Not jaundice Neurologic:  alert & oriented X3.  Speech normal, gait appropriate  for age and unassisted Psych--  Cognition and judgment appear intact.  Cooperative with normal attention span and concentration.  Behavior appropriate. No anxious or depressed appearing.    Assessment & Plan:    Persisting cough after URI DDX post URI cough, atypical infection, allergies, doubt GERD. Plan: Treat with Z-Pak, see instructions.

## 2015-02-27 NOTE — Patient Instructions (Signed)
Rest, fluids , tylenol if needed If  cough, take Mucinex DM twice a day as needed  OTC Nasocort or Flonase : 2 nasal sprays on each side of the nose daily until you feel better   Take the antibiotic as prescribed  (zithromax) Call if not gradually better over the next  10 days Call anytime if the symptoms are severe

## 2015-03-11 ENCOUNTER — Telehealth: Payer: Self-pay | Admitting: Internal Medicine

## 2015-03-11 NOTE — Telephone Encounter (Signed)
previsit letter for annual exam mailed  °

## 2015-03-14 ENCOUNTER — Other Ambulatory Visit: Payer: Self-pay

## 2015-03-14 ENCOUNTER — Telehealth: Payer: Self-pay | Admitting: Internal Medicine

## 2015-03-14 MED ORDER — HYDROCODONE-HOMATROPINE 5-1.5 MG/5ML PO SYRP: 5.0000 mL | ORAL_SOLUTION | Freq: Every evening | ORAL | Status: DC | PRN

## 2015-03-14 MED ORDER — BENZONATATE 200 MG PO CAPS: 200.0000 mg | ORAL_CAPSULE | Freq: Two times a day (BID) | ORAL | Status: DC | PRN

## 2015-03-14 MED ORDER — FLUTICASONE PROPIONATE 50 MCG/ACT NA SUSP: 2.0000 | Freq: Every day | NASAL | Status: DC

## 2015-03-14 NOTE — Telephone Encounter (Signed)
Pt was seen 02/27/2015 for cough and sore "scratchy" throat. Pt called stating he is still not feeling any better. Please advise.

## 2015-03-14 NOTE — Telephone Encounter (Signed)
Caller name: Cristo Relation to pt: self Call back number: [phone] Pharmacy: [facility] [address]  Reason for call:   Patient states that he still has a scratchy, sore throat. Has not gotten any better and wanted to know what Dr. Larose Kells recommend he do?  Also, requesting flonase refill

## 2015-03-14 NOTE — Telephone Encounter (Signed)
Continue Flonase. Will add Tessalon Perles twice a day for one week then as needed Also hydrocodone syrup every night for a week then as needed If he is not improving, needs to call or come back to the office

## 2015-03-14 NOTE — Telephone Encounter (Signed)
LMOM informing Pt of Dr. Larose Kells recommendations. Informed him Hydrocodone Rx will be placed at front desk for pick up at his earliest convenience.

## 2015-04-01 ENCOUNTER — Encounter: Payer: Self-pay | Admitting: Internal Medicine

## 2015-04-01 ENCOUNTER — Other Ambulatory Visit: Payer: Self-pay

## 2015-04-01 ENCOUNTER — Ambulatory Visit (INDEPENDENT_AMBULATORY_CARE_PROVIDER_SITE_OTHER): Payer: BLUE CROSS/BLUE SHIELD | Admitting: Internal Medicine

## 2015-04-01 VITALS — BP 124/68 | HR 57 | Temp 97.8°F | Ht 71.0 in | Wt 237.5 lb

## 2015-04-01 DIAGNOSIS — Z23 Encounter for immunization: Secondary | ICD-10-CM

## 2015-04-01 DIAGNOSIS — Z0000 Encounter for general adult medical examination without abnormal findings: Secondary | ICD-10-CM | POA: Diagnosis not present

## 2015-04-01 MED ORDER — OMEPRAZOLE 40 MG PO CPDR: 40.0000 mg | DELAYED_RELEASE_CAPSULE | Freq: Every day | ORAL | Status: DC

## 2015-04-01 NOTE — Patient Instructions (Signed)
  Please schedule labs to be done within few days (fasting)  Flonase and Claritin daily Omeprazole 40 mg one tablet before breakfast for one month, then as needed If the "scratchy throat" is not better in a month please call for a referral.   Come back to the office in 1 year  for a physical exam  Please schedule an appointment at the front desk    Come back fasting

## 2015-04-01 NOTE — Assessment & Plan Note (Signed)
Tdap today Diet and exercise discussed Labs: CMP, FLP, CBC, TSH, HIV, RPR and testosterone level per patient request. Denies problems with ED or decreased libido.

## 2015-04-01 NOTE — Progress Notes (Signed)
Pre visit review using our clinic review tool, if applicable. No additional management support is needed unless otherwise documented below in the visit note. 

## 2015-04-01 NOTE — Progress Notes (Signed)
Subjective:    Patient ID: Brandon Tyler, male    DOB: 08/15/1976, 39 y.o.   MRN: [id number]  DOS:  04/01/2015 Type of visit - description : cpx Interval history: In general feeling well, likes "every test done".    Review of Systems  Constitutional: No fever, chills. No unexplained wt changes. No unusual sweats HEENT: No dental problems, ear discharge, facial swelling, voice changes. No eye discharge, redness or intolerance to light Respiratory:  Recently seen with cough, eventually got better but had persistent scratchy throat. Not better with OTC omeprazole, Flonase, Claritin. Was taking the omeprazole at night. No wheezing or difficulty breathing.   Cardiovascular: No CP, leg swelling or palpitations GI: no nausea, vomiting, diarrhea or abdominal pain.  No blood in the stools. No dysphagia  ; occasional heartburn Endocrine: No polyphagia, polyuria or polydipsia GU: No dysuria, gross hematuria, difficulty urinating. No urinary urgency or frequency. Musculoskeletal: No joint swellings or unusual aches or pains Skin: No change in the color of the skin, palor or rash Allergic, immunologic: No environmental allergies or food allergies Neurological: No dizziness or syncope. No headaches. No diplopia, slurred speech, motor deficits, facial numbness Hematological: No enlarged lymph nodes, easy bruising or bleeding Psychiatry: No suicidal ideas, hallucinations, behavior problems or confusion. No unusual/severe anxiety or depression.    Past Medical History  Diagnosis Date  . Palpitations 2007     saw cards, got a ECHO, told he was ok  . H/O anxiety disorder   . Allergic rhinitis   . Arthritis     Past Surgical History  Procedure Laterality Date  . Tonsillectomy and adenoidectomy      History   Social History  . Marital Status: Married    Spouse Name: N/A  . Number of Children: 2  . 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 Smoking status: Never Smoker   . Smokeless tobacco: Never Used  . Alcohol Use: Yes     Comment: rarely   . Drug Use: No  . Sexual Activity: Not on file   Other Topics Concern  . Not on file   Social History Narrative   Lives w/ wife and 2 children  , 2009 and 2006     Family History  Problem Relation Age of Onset  . Hyperlipidemia Maternal Grandmother   . Diabetes Other     distant relative   . CAD Other     MGM  . Colon cancer Neg Hx   . Prostate cancer Other     GF  . Pancreatic cancer Other     GM, uncle  . Throat cancer Other     GF       Medication List       This list is accurate as of: 04/01/15  5:48 PM.  Always use your most recent med list.               fluticasone 50 MCG/ACT nasal spray  Commonly known as:  FLONASE  Place 2 sprays into both nostrils daily.     omeprazole 40 MG capsule  Commonly known as:  PRILOSEC  Take 1 capsule (40 mg total) by mouth daily.     valACYclovir 500 MG tablet  Commonly known as:  VALTREX  Take 500 mg by mouth 2 (two) times daily. FOR FEVER BLISTERS           Objective:  Physical Exam BP 124/68 mmHg  Pulse 57  Temp(Src) 97.8 F (36.6 C) (Oral)  Ht 5\' 11"  (1.803 m)  Wt 237 lb 8 oz (107.729 kg)  BMI 33.14 kg/m2  SpO2 97% General:   Well developed, well nourished . NAD.  Neck:  Full range of motion. Supple. No  thyromegaly , normal carotid pulse HEENT:  Normocephalic . Face symmetric, atraumatic; throat symmetric without redness or discharge Lungs:  CTA B Normal respiratory effort, no intercostal retractions, no accessory muscle use. Heart: RRR,  no murmur.  No pretibial edema bilaterally  Abdomen:  Not distended, soft, non-tender. No rebound or rigidity. No mass,organomegaly Skin: Exposed areas without rash. Not pale. Not jaundice Neurologic:  alert & oriented X3.  Speech normal, gait appropriate for age and unassisted Strength symmetric and appropriate for  age.  Psych: Cognition and judgment appear intact.  Cooperative with normal attention span and concentration.  Behavior appropriate. No anxious or depressed appearing.        Assessment & Plan:    "Scratchy throat" Likely a combination of GERD and allergies. Recommend consistent use of Flonase, Claritin and   omeprazole before breakfast. If not better next month he is to let me know

## 2015-04-02 ENCOUNTER — Other Ambulatory Visit (INDEPENDENT_AMBULATORY_CARE_PROVIDER_SITE_OTHER): Payer: BLUE CROSS/BLUE SHIELD

## 2015-04-02 DIAGNOSIS — Z0000 Encounter for general adult medical examination without abnormal findings: Secondary | ICD-10-CM

## 2015-04-02 LAB — CBC WITH DIFFERENTIAL/PLATELET
BASOS ABS: 0 10*3/uL (ref 0.0–0.1)
BASOS PCT: 0.5 % (ref 0.0–3.0)
EOS ABS: 0.2 10*3/uL (ref 0.0–0.7)
Eosinophils Relative: 4.6 % (ref 0.0–5.0)
HEMATOCRIT: 46.2 % (ref 39.0–52.0)
HEMOGLOBIN: 15.9 g/dL (ref 13.0–17.0)
LYMPHS ABS: 1.4 10*3/uL (ref 0.7–4.0)
Lymphocytes Relative: 30.9 % (ref 12.0–46.0)
MCHC: 34.5 g/dL (ref 30.0–36.0)
MCV: 90.4 fl (ref 78.0–100.0)
MONOS PCT: 7.5 % (ref 3.0–12.0)
Monocytes Absolute: 0.3 10*3/uL (ref 0.1–1.0)
NEUTROS ABS: 2.5 10*3/uL (ref 1.4–7.7)
Neutrophils Relative %: 56.5 % (ref 43.0–77.0)
Platelets: 176 10*3/uL (ref 150.0–400.0)
RBC: 5.11 Mil/uL (ref 4.22–5.81)
RDW: 13.4 % (ref 11.5–15.5)
WBC: 4.5 10*3/uL (ref 4.0–10.5)

## 2015-04-02 LAB — COMPREHENSIVE METABOLIC PANEL
ALK PHOS: 37 U/L — AB (ref 39–117)
ALT: 26 U/L (ref 0–53)
AST: 28 U/L (ref 0–37)
Albumin: 4.4 g/dL (ref 3.5–5.2)
BILIRUBIN TOTAL: 0.5 mg/dL (ref 0.2–1.2)
BUN: 14 mg/dL (ref 6–23)
CALCIUM: 9.7 mg/dL (ref 8.4–10.5)
CO2: 31 mEq/L (ref 19–32)
Chloride: 103 mEq/L (ref 96–112)
Creatinine, Ser: 1.33 mg/dL (ref 0.40–1.50)
GFR: 77.69 mL/min (ref >60.00)
Glucose, Bld: 107 mg/dL — ABNORMAL HIGH (ref 70–99)
Potassium: 4 mEq/L (ref 3.5–5.1)
SODIUM: 138 meq/L (ref 135–145)
TOTAL PROTEIN: 7 g/dL (ref 6.0–8.3)

## 2015-04-02 LAB — LIPID PANEL
Cholesterol: 226 mg/dL — ABNORMAL HIGH (ref 0–200)
HDL: 41.5 mg/dL (ref >39.00)
LDL Cholesterol: 171 mg/dL — ABNORMAL HIGH (ref 0–99)
NONHDL: 184.5
Total CHOL/HDL Ratio: 5
Triglycerides: 66 mg/dL (ref 0.0–149.0)
VLDL: 13.2 mg/dL (ref 0.0–40.0)

## 2015-04-02 LAB — TESTOSTERONE: TESTOSTERONE: 459.81 ng/dL (ref 300.00–890.00)

## 2015-04-02 LAB — TSH: TSH: 0.91 u[IU]/mL (ref 0.35–4.50)

## 2015-04-03 ENCOUNTER — Other Ambulatory Visit: Payer: Self-pay

## 2015-04-03 LAB — RPR

## 2015-04-03 LAB — HIV ANTIBODY (ROUTINE TESTING W REFLEX): HIV 1&2 Ab, 4th Generation: NONREACTIVE

## 2015-04-04 ENCOUNTER — Other Ambulatory Visit (INDEPENDENT_AMBULATORY_CARE_PROVIDER_SITE_OTHER): Payer: BLUE CROSS/BLUE SHIELD

## 2015-04-04 DIAGNOSIS — R7309 Other abnormal glucose: Secondary | ICD-10-CM

## 2015-04-04 DIAGNOSIS — R739 Hyperglycemia, unspecified: Secondary | ICD-10-CM

## 2015-04-04 LAB — HEMOGLOBIN A1C: Hgb A1c MFr Bld: 5.8 % (ref 4.6–6.5)

## 2015-04-05 ENCOUNTER — Encounter: Payer: Self-pay | Admitting: Internal Medicine

## 2015-05-02 ENCOUNTER — Other Ambulatory Visit: Payer: Self-pay | Admitting: Internal Medicine

## 2015-05-02 NOTE — Telephone Encounter (Signed)
Caller name: Keagon Relation to DV:OUZH Call back [phone] Pharmacy: [facility], [place]  Reason for call:Pt requesting rx for  valACYclovir (VALTREX) 500 MG tablet. Please advise.

## 2015-05-03 MED ORDER — VALACYCLOVIR HCL 500 MG PO TABS: 500.0000 mg | ORAL_TABLET | Freq: Two times a day (BID) | ORAL | Status: DC

## 2015-05-03 NOTE — Telephone Encounter (Signed)
Pt is requesting refill on Valtrex, do not see in medical history where we have ever refilled medication. Okay to refill?

## 2015-05-03 NOTE — Telephone Encounter (Signed)
takes as needed for fever blisters, prescription ready to be send

## 2015-11-12 ENCOUNTER — Telehealth: Payer: Self-pay | Admitting: Internal Medicine

## 2015-11-12 NOTE — Telephone Encounter (Signed)
Pt never picked up rx dated 03-14-15, rx was shredded

## 2015-11-28 ENCOUNTER — Encounter: Payer: Self-pay | Admitting: Internal Medicine

## 2015-12-02 ENCOUNTER — Telehealth: Payer: Self-pay | Admitting: Internal Medicine

## 2015-12-02 ENCOUNTER — Ambulatory Visit (INDEPENDENT_AMBULATORY_CARE_PROVIDER_SITE_OTHER): Payer: BLUE CROSS/BLUE SHIELD | Admitting: Internal Medicine

## 2015-12-02 ENCOUNTER — Encounter: Payer: Self-pay | Admitting: Internal Medicine

## 2015-12-02 VITALS — BP 112/78 | HR 58 | Temp 98.3°F | Ht 71.0 in | Wt 240.5 lb

## 2015-12-02 DIAGNOSIS — M25562 Pain in left knee: Secondary | ICD-10-CM

## 2015-12-02 DIAGNOSIS — R569 Unspecified convulsions: Secondary | ICD-10-CM

## 2015-12-02 NOTE — Progress Notes (Signed)
Subjective:    Patient ID: Brandon Tyler, male    DOB: Sep 07, 1976, 40 y.o.   MRN: [id number]  DOS:  12/02/2015 Type of visit - description : Acute visit Interval history: Having episodes for the last 7 years, last one was a week ago, previous one March 2016. Reports that suddenly he feels like he got disconnected from his environment, palms get sweaty, he has palpitations. Also has jerking muscle movements that he can control whenever he tries to use his arms or legs. Symptoms last for a few minutes, then he "reconnect", this is creating a lot of anxiety, he does not believe anxiety is the primary issue. His observation is that the symptoms are triggered by sleep deprivation or poor sleep.   Review of Systems  Currently with no major stressors on his life. Denies any headaches to me. No visual disturbances, nausea, vomiting. No head injury. No Dizziness, diplopia, motor deficits. He has noted some difficulty with his speech whenever he has the events.  Past Medical History  Diagnosis Date  . Palpitations 2007     saw cards, got a ECHO, told he was ok  . H/O anxiety disorder   . Allergic rhinitis   . Arthritis     Past Surgical History  Procedure Laterality Date  . Tonsillectomy and adenoidectomy      Social History   Social History  . Marital Status: Married    Spouse Name: N/A  . Number of Children: 2  . 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 Smoking status: Never Smoker   . Smokeless tobacco: Never Used  . Alcohol Use: Yes     Comment: rarely   . Drug Use: No  . Sexual Activity: Not on file   Other Topics Concern  . Not on file   Social History Narrative   Lives w/ wife and 2 children  , 2009 and 2006        Medication List       This list is accurate as of: 12/02/15  6:30 PM.  Always use your most recent med list.               ALPRAZolam 0.25 MG tablet  Commonly  known as:  XANAX  Take 0.25 mg by mouth at bedtime as needed for anxiety.     fluticasone 50 MCG/ACT nasal spray  Commonly known as:  FLONASE  Place 2 sprays into both nostrils daily.     valACYclovir 500 MG tablet  Commonly known as:  VALTREX  Take 1 tablet (500 mg total) by mouth 2 (two) times daily. FOR FEVER BLISTERS           Objective:   Physical Exam BP 112/78 mmHg  Pulse 58  Temp(Src) 98.3 F (36.8 C) (Oral)  Ht 5\' 11"  (1.803 m)  Wt 240 lb 8 oz (109.09 kg)  BMI 33.56 kg/m2  SpO2 97% General:   Well developed, well nourished . NAD.  HEENT:  Normocephalic . Face symmetric, atraumatic. Neck: Normal carotid pulses Lungs:  CTA B Normal respiratory effort, no intercostal retractions, no accessory muscle use. Heart: RRR,  no murmur.  No pretibial edema bilaterally  Skin: Not pale. Not jaundice Neurologic:  alert & oriented X3.  Speech normal, gait appropriate for age and unassisted. Motor and DTRs symmetric. Coordination normal. EOMI Psych--  Cognition and judgment appear intact.  Cooperative with normal attention span  and concentration.  Behavior appropriate. No anxious or depressed appearing.      Assessment & Plan:  Assessment: Anxiety? Seizure disorder?  Plan: Seizure disorder? I agree with the patient that the symptoms sound like a seizure b/c the description and because symptoms are worse whenever he is a sleep deprived. DDX includes atypical migraine, strokes,others ( less likely). He went through similar symptoms 7 years ago, saw [facility], workup was negative. Interestingly at that time he had a lot of episodes and he was not  sleeping well because of her newly born daughter. Also reports left knee pain, persisting: Refer to sports medicine

## 2015-12-02 NOTE — Telephone Encounter (Signed)
Work note released to [employer] as requested.

## 2015-12-02 NOTE — Progress Notes (Signed)
Pre visit review using our clinic review tool, if applicable. No additional management support is needed unless otherwise documented below in the visit note. 

## 2015-12-02 NOTE — Telephone Encounter (Signed)
Pt called to get a work note, he was out today and returning tomorrow. Please release to his mychart.

## 2015-12-05 ENCOUNTER — Ambulatory Visit: Payer: BLUE CROSS/BLUE SHIELD | Admitting: Family Medicine

## 2015-12-05 ENCOUNTER — Encounter: Payer: Self-pay | Admitting: Family Medicine

## 2015-12-05 ENCOUNTER — Ambulatory Visit (INDEPENDENT_AMBULATORY_CARE_PROVIDER_SITE_OTHER): Payer: BLUE CROSS/BLUE SHIELD | Admitting: Family Medicine

## 2015-12-05 ENCOUNTER — Ambulatory Visit (HOSPITAL_BASED_OUTPATIENT_CLINIC_OR_DEPARTMENT_OTHER)
Admission: RE | Admit: 2015-12-05 | Discharge: 2015-12-05 | Disposition: A | Discharge status: Home / Self Care | Payer: BLUE CROSS/BLUE SHIELD | Source: Ambulatory Visit | Attending: Family Medicine | Admitting: Family Medicine

## 2015-12-05 VITALS — BP 122/79 | HR 55 | Ht 71.0 in | Wt 240.0 lb

## 2015-12-05 DIAGNOSIS — M25562 Pain in left knee: Secondary | ICD-10-CM

## 2015-12-05 NOTE — Assessment & Plan Note (Signed)
independently reviewed radiographs and minimal DJD medially.  Pain most consistent with patellofemoral syndrome though discussed this minimal DJD could cause his pain as well.  Shown home exercises to do daily, encouraged arch supports.  Icing/tylenol/nsaids discussed.  F/u in 6 weeks.

## 2015-12-05 NOTE — Patient Instructions (Signed)
You have patellofemoral syndrome Avoid painful activities when possible (often deep squats, lunges bother this) Cross train with swimming, cycling with low resistance, elliptical if needed. Straight leg raise, hip side raises, straight leg raises with foot turned outwards 3 sets of 10 once a day. Add ankle weight if these become too easy. Consider formal physical therapy Correct flat feet with something like dr. Zoe Lan active series or superfeet insoles. Avoid flat shoes, barefoot walking as much as possible the next 6 weeks. Icing 15 minutes at a time 3-4 times a day as needed. Tylenol or aleve as needed for pain Follow up with me in 6 weeks.

## 2015-12-05 NOTE — Progress Notes (Signed)
PCP and consultation requested by: Kathlene November, MD  Subjective:   HPI: Patient is a 40 y.o. male here for left knee pain.  Patient denies known injury or trauma. Recalls about 2 years ago after playing basketball his left knee felt swollen but again no injury. Plays golf, kickball, and bowling. Pain level 6/10 at worst, achy. Pain more consistent lately but over a year or so has been off and on. Once knee felt like it was going to give out. Tried TENS unit but no medications. No skin changes, fever, other complaints.  Past Medical History  Diagnosis Date  . Palpitations 2007     saw cards, got a ECHO, told he was ok  . H/O anxiety disorder   . Allergic rhinitis   . Arthritis     Current Outpatient Prescriptions on File Prior to Visit  Medication Sig Dispense Refill  . ALPRAZolam (XANAX) 0.25 MG tablet Take 0.25 mg by mouth at bedtime as needed for anxiety.    . fluticasone (FLONASE) 50 MCG/ACT nasal spray Place 2 sprays into both nostrils daily. 16 g 12  . valACYclovir (VALTREX) 500 MG tablet Take 1 tablet (500 mg total) by mouth 2 (two) times daily. FOR FEVER BLISTERS 30 tablet 0   No current facility-administered medications on file prior to visit.    Past Surgical History  Procedure Laterality Date  . Tonsillectomy and adenoidectomy      No Known Allergies  Social History   Social History  . Marital Status: Married    Spouse Name: N/A  . Number of Children: 2  . 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 Smoking status: Never Smoker   . Smokeless tobacco: Never Used  . Alcohol Use: 0.0 oz/week    0 Standard drinks or equivalent per week     Comment: rarely   . Drug Use: No  . Sexual Activity: Not on file   Other Topics Concern  . Not on file   Social History Narrative   Lives w/ wife and 2 children  , 2009 and 2006    Family History  Problem Relation Age of Onset  .  Hyperlipidemia Maternal Grandmother   . Diabetes Other     distant relative   . CAD Other     MGM  . Colon cancer Neg Hx   . Prostate cancer Other     GF  . Pancreatic cancer Other     GM, uncle  . Throat cancer Other     GF    BP 122/79 mmHg  Pulse 55  Ht 5\' 11"  (1.803 m)  Wt 240 lb (108.863 kg)  BMI 33.49 kg/m2  Review of Systems: See HPI above.    Objective:  Physical Exam:  Gen: NAD  Left knee: Pes planus. No gross deformity, ecchymoses, swelling.  VMO atrophy. No TTP currently. FROM.  Hip abduction 5/5 strength. Negative ant/post drawers. Negative valgus/varus testing. Negative lachmanns. Negative mcmurrays, apleys, patellar apprehension. NV intact distally.  Right knee: FROM without pain.   Assessment & Plan:  1. Left knee pain - independently reviewed radiographs and minimal DJD medially.  Pain most consistent with patellofemoral syndrome though discussed this minimal DJD could cause his pain as well.  Shown home exercises to do daily, encouraged arch supports.  Icing/tylenol/nsaids discussed.  F/u in 6 weeks.

## 2015-12-24 ENCOUNTER — Ambulatory Visit (INDEPENDENT_AMBULATORY_CARE_PROVIDER_SITE_OTHER): Payer: BLUE CROSS/BLUE SHIELD | Admitting: Neurology

## 2015-12-24 ENCOUNTER — Encounter: Payer: Self-pay | Admitting: Neurology

## 2015-12-24 VITALS — BP 122/77 | HR 60 | Ht 71.0 in | Wt 244.0 lb

## 2015-12-24 DIAGNOSIS — R410 Disorientation, unspecified: Secondary | ICD-10-CM

## 2015-12-24 DIAGNOSIS — F411 Generalized anxiety disorder: Secondary | ICD-10-CM | POA: Diagnosis not present

## 2015-12-24 MED ORDER — LAMOTRIGINE 25 MG PO TABS: ORAL_TABLET | ORAL | Status: DC

## 2015-12-24 MED ORDER — LAMOTRIGINE 100 MG PO TABS: 100.0000 mg | ORAL_TABLET | Freq: Two times a day (BID) | ORAL | Status: DC

## 2015-12-24 NOTE — Progress Notes (Signed)
PATIENT: Brandon Tyler DOB: 1976/01/07  Chief Complaint  Patient presents with  . Seizure-like activity    He describes episodes as heart racing, palms sweating and muscles twitching.  They typically only last a few minutes and seem to occur more when he is sleep deprived.  Says he was evaluated for these symptoms 7 years ago. He was diagnosed with anxiety and placed on Xanax.  He does remember ever having an EEG.     HISTORICAL  Brandon Tyler is a 33 right-handed male, alone at today's clinical visit, seen in refer by  his primary care physician Dr. Belinda Fisher for recurrent episodes of heart racing sweaty muscle twitching suspicious for possible seizure  He reported recurrent stereotypical episode since 2009, it is usually happened after he woke up from sleep, he felt funny, sweaty, heart racing fast, then followed by body jerking movement, mild confusion, lasting for a few minutes, but he never totally lost consciousness.  He had frequent occurrence initially, up to weekly basis, he was evaluated around 2010, I reviewed MRI of the brain with without contrast from [facility], that was normal then.  He was diagnosed with possible anxiety disorder, put on Xanax as needed, was never treated with antiseptic medications.  His mother suffered epilepsy, sounds like complex partial seizure with secondary generalization, which she did outgrown in her middle-age.  He denies febrile seizure, childhood seizure.  He had a recurrent episode in December 01 2015, he work up from nap, he had that overwhelming weird sensation, followed by sweaty, palpitation, body jerking movement, last 2-3 minutes, before that episode was in summer of 2016.   REVIEW OF SYSTEMS: Full 14 system review of systems performed and notable only for depression, not enough sleep, weight gain, confusion, insomnia ALLERGIES: No Known Allergies  HOME MEDICATIONS: Current Outpatient Prescriptions  Medication Sig Dispense  Refill  . ALPRAZolam (XANAX) 0.25 MG tablet Take 0.25 mg by mouth at bedtime as needed for anxiety.    . fluticasone (FLONASE) 50 MCG/ACT nasal spray Place 2 sprays into both nostrils daily. 16 g 12  . valACYclovir (VALTREX) 500 MG tablet Take 1 tablet (500 mg total) by mouth 2 (two) times daily. FOR FEVER BLISTERS 30 tablet 0   No current facility-administered medications for this visit.    PAST MEDICAL HISTORY: Past Medical History  Diagnosis Date  . Palpitations 2007     saw cards, got a ECHO, told he was ok  . H/O anxiety disorder   . Allergic rhinitis   . Arthritis     PAST SURGICAL HISTORY: Past Surgical History  Procedure Laterality Date  . Tonsillectomy and adenoidectomy      FAMILY HISTORY: Family History  Problem Relation Age of Onset  . Hyperlipidemia Maternal Grandmother   . Diabetes Other     distant relative   . CAD Other     MGM  . Colon cancer Neg Hx   . Prostate cancer Other     GF  . Pancreatic cancer Other     GM, uncle  . Throat cancer Other     GF  . Hypertension Mother   . Hyperlipidemia Mother   . Hypertension Father   . Hyperlipidemia Father     SOCIAL HISTORY:  Social History   Social History  . Marital Status: Married    Spouse Name: N/A  . Number of Children: 2  . Years of Education: 16   Occupational History  . service specialist [employer]  Social History Main Topics  . Smoking status: Never Smoker   . Smokeless tobacco: Never Used  . Alcohol Use: 0.0 oz/week    0 Standard drinks or equivalent per week     Comment: rarely   . Drug Use: No     Comment: Previously used marijuana.  . Sexual Activity: Not on file   Other Topics Concern  . Not on file   Social History Narrative   Lives w/ wife and 2 children  , 2009 and 2006   Right-handed.   Drinks 3 diet sodas per day.     PHYSICAL EXAM   Filed Vitals:   12/24/15 1504  BP: 122/77  Pulse: 60  Height: 5\' 11"  (1.803 m)  Weight: 244 lb (110.678 kg)     Not recorded      Body mass index is 34.05 kg/(m^2).  PHYSICAL EXAMNIATION:  Gen: NAD, conversant, well nourised, obese, well groomed                     Cardiovascular: Regular rate rhythm, no peripheral edema, warm, nontender. Eyes: Conjunctivae clear without exudates or hemorrhage Neck: Supple, no carotid bruise. Pulmonary: Clear to auscultation bilaterally   NEUROLOGICAL EXAM:  MENTAL STATUS: Speech:    Speech is normal; fluent and spontaneous with normal comprehension.  Cognition:     Orientation to time, place and person     Normal recent and remote memory     Normal Attention span and concentration     Normal Language, naming, repeating,spontaneous speech     Fund of knowledge   CRANIAL NERVES: CN II: Visual fields are full to confrontation. Fundoscopic exam is normal with sharp discs and no vascular changes. Pupils are round equal and briskly reactive to light. CN III, IV, VI: extraocular movement are normal. No ptosis. CN V: Facial sensation is intact to pinprick in all 3 divisions bilaterally. Corneal responses are intact.  CN VII: Face is symmetric with normal eye closure and smile. CN VIII: Hearing is normal to rubbing fingers CN IX, X: Palate elevates symmetrically. Phonation is normal. CN XI: Head turning and shoulder shrug are intact CN XII: Tongue is midline with normal movements and no atrophy.  MOTOR: There is no pronator drift of out-stretched arms. Muscle bulk and tone are normal. Muscle strength is normal.  REFLEXES: Reflexes are 2+ and symmetric at the biceps, triceps, knees, and ankles. Plantar responses are flexor.  SENSORY: Intact to light touch, pinprick, position sense, and vibration sense are intact in fingers and toes.  COORDINATION: Rapid alternating movements and fine finger movements are intact. There is no dysmetria on finger-to-nose and heel-knee-shin.    GAIT/STANCE: Posture is normal. Gait is steady with normal steps, base, arm  swing, and turning. Heel and toe walking are normal. Tandem gait is normal.  Romberg is absent.   DIAGNOSTIC DATA (LABS, IMAGING, TESTING) - I reviewed patient records, labs, notes, testing and imaging myself where available.   ASSESSMENT AND PLAN  Brandon Tyler is a 40 y.o. 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 Ph.D.  [facility] [address], [place], [place] [address] Ph: [phone] Fax: [phone]  CC: Colon Branch, MD

## 2015-12-27 ENCOUNTER — Ambulatory Visit (INDEPENDENT_AMBULATORY_CARE_PROVIDER_SITE_OTHER): Payer: BLUE CROSS/BLUE SHIELD | Admitting: Diagnostic Neuroimaging

## 2015-12-27 DIAGNOSIS — R410 Disorientation, unspecified: Secondary | ICD-10-CM

## 2016-01-02 NOTE — Procedures (Signed)
   [facility]  EEG (ELECTROENCEPHALOGRAM) REPORT   STUDY DATE: 12/27/15 PATIENT NAME: Brandon Tyler DOB: Jun 29, 1976 MRN: [id number]  ORDERING CLINICIAN: Marcial Pacas, MD PhD   TECHNOLOGIST: Laretta Alstrom  TECHNIQUE: Electroencephalogram was recorded utilizing standard 10-20 system of lead placement and reformatted into average and bipolar montages.  RECORDING TIME: 30 minutes  ACTIVATION: hyperventilation and photic stimulation  CLINICAL INFORMATION: 40 year old male with abnormal spells (seizure vs anxiety).   FINDINGS: Background rhythms of 9-10 hertz and 15-20 microvolts. No focal, lateralizing, epileptiform activity or seizures are seen. Patient recorded in the awake, drowsy and asleep states. Sleep spindles and vertex waves noted during sleep. EKG channel shows 66 beats per minute.   IMPRESSION:  Normal EEG in the awake and asleep states.     INTERPRETING PHYSICIAN:  Penni Bombard, MD Certified in Neurology, Neurophysiology and Neuroimaging  [facility] [address] [place], [place] [address] [phone]

## 2016-02-27 ENCOUNTER — Other Ambulatory Visit: Payer: Self-pay

## 2016-02-27 MED ORDER — VALACYCLOVIR HCL 500 MG PO TABS: 500.0000 mg | ORAL_TABLET | Freq: Two times a day (BID) | ORAL | Status: DC | PRN

## 2016-03-26 ENCOUNTER — Encounter: Payer: Self-pay | Admitting: Internal Medicine

## 2016-03-26 MED ORDER — ALPRAZOLAM 0.25 MG PO TABS: 0.2500 mg | ORAL_TABLET | Freq: Every evening | ORAL | Status: DC | PRN

## 2016-03-26 NOTE — Telephone Encounter (Signed)
Send a prescription for Xanax, #10, no refills  Received: Today    Colon Branch, MD  Damita Dunnings, CMA   Rx printed, awaiting MD signature.

## 2016-03-26 NOTE — Telephone Encounter (Signed)
Received MyChart message from Pt, requesting refills on Alprazolam, medication on list, but do not see where you have ever filled medication. Pt has CPE scheduled for 04/03/2016. Please advise.

## 2016-03-26 NOTE — Telephone Encounter (Signed)
Rx faxed to [facility] pharmacy.  

## 2016-04-03 ENCOUNTER — Ambulatory Visit (INDEPENDENT_AMBULATORY_CARE_PROVIDER_SITE_OTHER): Payer: BLUE CROSS/BLUE SHIELD | Admitting: Internal Medicine

## 2016-04-03 ENCOUNTER — Encounter: Payer: Self-pay | Admitting: Internal Medicine

## 2016-04-03 VITALS — BP 122/60 | HR 58 | Temp 97.6°F | Ht 71.0 in | Wt 245.1 lb

## 2016-04-03 DIAGNOSIS — E785 Hyperlipidemia, unspecified: Secondary | ICD-10-CM

## 2016-04-03 DIAGNOSIS — Z0000 Encounter for general adult medical examination without abnormal findings: Secondary | ICD-10-CM | POA: Diagnosis not present

## 2016-04-03 DIAGNOSIS — Z114 Encounter for screening for human immunodeficiency virus [HIV]: Secondary | ICD-10-CM | POA: Diagnosis not present

## 2016-04-03 DIAGNOSIS — L989 Disorder of the skin and subcutaneous tissue, unspecified: Secondary | ICD-10-CM | POA: Diagnosis not present

## 2016-04-03 LAB — BASIC METABOLIC PANEL
BUN: 21 mg/dL (ref 7–25)
CHLORIDE: 105 mmol/L (ref 98–110)
CO2: 21 mmol/L (ref 20–31)
CREATININE: 1.43 mg/dL — AB (ref 0.60–1.35)
Calcium: 9.6 mg/dL (ref 8.6–10.3)
GLUCOSE: 90 mg/dL (ref 65–99)
POTASSIUM: 3.8 mmol/L (ref 3.5–5.3)
Sodium: 138 mmol/L (ref 135–146)

## 2016-04-03 LAB — LIPID PANEL
CHOLESTEROL: 257 mg/dL — AB (ref 125–200)
HDL: 39 mg/dL — ABNORMAL LOW (ref >=40)
LDL CALC: 198 mg/dL — AB (ref <130)
Total CHOL/HDL Ratio: 6.6 Ratio — ABNORMAL HIGH (ref <=5.0)
Triglycerides: 101 mg/dL (ref <150)
VLDL: 20 mg/dL (ref <30)

## 2016-04-03 LAB — HIV ANTIBODY (ROUTINE TESTING W REFLEX): HIV: NONREACTIVE

## 2016-04-03 MED ORDER — VALACYCLOVIR HCL 500 MG PO TABS: 500.0000 mg | ORAL_TABLET | Freq: Every day | ORAL | Status: DC

## 2016-04-03 MED ORDER — ALPRAZOLAM 0.25 MG PO TABS: 0.2500 mg | ORAL_TABLET | Freq: Every evening | ORAL | Status: DC | PRN

## 2016-04-03 NOTE — Progress Notes (Signed)
Pre visit review using our clinic review tool, if applicable. No additional management support is needed unless otherwise documented below in the visit note. 

## 2016-04-03 NOTE — Assessment & Plan Note (Signed)
Tdap 2016 Diet and exercise discussed Labs: BMP, FLP, A1c, TSH, HIV

## 2016-04-03 NOTE — Patient Instructions (Signed)
GO TO THE LAB : Get the blood work     GO TO THE FRONT DESK Schedule your next appointment for a checkup in 6 months, no fasting

## 2016-04-03 NOTE — Progress Notes (Signed)
Subjective:    Patient ID: Brandon Tyler, male    DOB: 03/25/1976, 40 y.o.   MRN: [id number]  DOS:  04/03/2016 Type of visit - description : CPX Interval history: We also discussed a number of issues    Review of Systems Constitutional: No fever. No chills. No unexplained wt changes. No unusual sweats  HEENT: No dental problems, no ear discharge, no facial swelling, no voice changes. No eye discharge, no eye  redness , no  intolerance to light   Respiratory: No wheezing , no  difficulty breathing. No cough , no mucus production  Cardiovascular: No CP, no leg swelling , no  Palpitations  GI: no nausea, no vomiting, no diarrhea , no  abdominal pain.  No blood in the stools. No dysphagia, no odynophagia    Endocrine: No polyphagia, no polyuria , no polydipsia  GU: No dysuria, gross hematuria, difficulty urinating. No urinary urgency, no frequency.  Musculoskeletal: No joint swellings or unusual aches or pains  Skin: No change in the color of the skin, palor , no  Rash. Fever blisters every 2 weeks, on Valtrex as needed  Allergic, immunologic: No environmental allergies , no  food allergies  Neurological: Continue with episodes as previously described, had a severe one last month, EMS was called, BP, blood sugar and heart rate  were okay. Previously Xanax helped to some extent. He did see neurology but decided not to take antiseizure medication. Hematological: No enlarged lymph nodes, no easy bruising , no unusual bleedings  Psychiatry: No suicidal ideas, no hallucinations, no beavior problems, no confusion.  Some anxiety and depression, reports he is a perfectionist and put a lot of pressure on himself.  Past Medical History  Diagnosis Date  . Palpitations 2007     saw cards, got a ECHO, told he was ok  . H/O anxiety disorder   . Allergic rhinitis   . Arthritis     Past Surgical History  Procedure Laterality Date  . Tonsillectomy and adenoidectomy      Social  History   Social History  . Marital Status: Married    Spouse Name: N/A  . Number of Children: 2  . 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 Smoking status: Never Smoker   . Smokeless tobacco: Never Used  . Alcohol Use: 0.0 oz/week    0 Standard drinks or equivalent per week     Comment: rarely   . Drug Use: No     Comment: Previously used marijuana.  . Sexual Activity: Not on file   Other Topics Concern  . Not on file   Social History Narrative   Lives w/ wife and 2 children  , 2009 and 2006   Right-handed.   Drinks 3 diet sodas per day.     Family History  Problem Relation Age of Onset  . Hyperlipidemia Maternal Grandmother   . Diabetes Other     distant relative   . CAD Other     MGM  . Colon cancer Neg Hx   . Prostate cancer Other     GF  . Pancreatic cancer Other     GM, uncle  . Throat cancer Other     GF  . Hypertension Mother   . Hyperlipidemia Mother   . Hypertension Father   . Hyperlipidemia Father        Medication List  This list is accurate as of: 04/03/16 11:59 PM.  Always use your most recent med list.               ALPRAZolam 0.25 MG tablet  Commonly known as:  XANAX  Take 1 tablet (0.25 mg total) by mouth at bedtime as needed for anxiety.     fluticasone 50 MCG/ACT nasal spray  Commonly known as:  FLONASE  Place 2 sprays into both nostrils daily.     valACYclovir 500 MG tablet  Commonly known as:  VALTREX  Take 1 tablet (500 mg total) by mouth daily. FOR FEVER BLISTERS           Objective:   Physical Exam BP 122/60 mmHg  Pulse 58  Temp(Src) 97.6 F (36.4 C) (Oral)  Ht 5\' 11"  (1.803 m)  Wt 245 lb 2 oz (111.188 kg)  BMI 34.20 kg/m2  SpO2 96%  General:   Well developed, well nourished . NAD.  Neck: No  thyromegaly  HEENT:  Normocephalic . Face symmetric, atraumatic. TMs normal, nose not congested, throat symmetric, no red Lungs:  CTA  B Normal respiratory effort, no intercostal retractions, no accessory muscle use. Heart: RRR,  no murmur.  No pretibial edema bilaterally  Abdomen:  Not distended, soft, non-tender. No rebound or rigidity.   Skin: Exposed areas without rash. Not pale. Not jaundice Neurologic:  alert & oriented X3.  Speech normal, gait appropriate for age and unassisted Strength symmetric and appropriate for age.  Psych: Cognition and judgment appear intact.  Cooperative with normal attention span and concentration.  Behavior appropriate. No anxious or depressed appearing.    Assessment & Plan:   Assessment: Anxiety? Seizure disorder? Saw neuro 12-2015, dx w/ SZ, Rx meds, EEG (-) 12-2015 Palpitations 2007, saw cards, got ECHO  Plan: Seizure disorder? See previous office visit, episodes are going on for 7 years, had another one last month; he saw neuro 12-2015, then a EEG was (-). He was RX (but decided not to take)  LAMICTAL, not ready to  commit to long-term medications. Advised patient a normal EEG does not rule out seizures, risks of untreated seizures discussed. Encouraged to discuss the benefits of taking medication at the time of neuro f/u. He thinks part of the problem is anxiety. Previously was prescribed Xanax for these episodes and that  seem to help. Anxiety? The patient reports some anxiety and depression mostly because he is a perfectionist and put a lot of pressure on himself. We talk about  SSRIs, willing to do a trial to see if that helped with anxiety and possibly w/ the episodes that he is having. He will let me know if interested. Fever blisters: Every 2 weeks, will increase Valtrex prn to qd  Also needs a dermatology referral[place]. Will do RTC 6 months

## 2016-04-04 LAB — TSH: TSH: 1.02 m[IU]/L (ref 0.40–4.50)

## 2016-04-04 LAB — HEMOGLOBIN A1C
Hgb A1c MFr Bld: 5.7 % — ABNORMAL HIGH (ref <5.7)
MEAN PLASMA GLUCOSE: 117 mg/dL

## 2016-04-05 DIAGNOSIS — Z09 Encounter for follow-up examination after completed treatment for conditions other than malignant neoplasm: Secondary | ICD-10-CM | POA: Insufficient documentation

## 2016-04-05 NOTE — Assessment & Plan Note (Signed)
Seizure disorder? See previous office visit, episodes are going on for 7 years, had another one last month; he saw neuro 12-2015, then a EEG was (-). He was RX (but decided not to take)  LAMICTAL, not ready to  commit to long-term medications. Advised patient a normal EEG does not rule out seizures, risks of untreated seizures discussed. Encouraged to discuss the benefits of taking medication at the time of neuro f/u. He thinks part of the problem is anxiety. Previously was prescribed Xanax for these episodes and that  seem to help. Anxiety? The patient reports some anxiety and depression mostly because he is a perfectionist and put a lot of pressure on himself. We talk about  SSRIs, willing to do a trial to see if that helped with anxiety and possibly w/ the episodes that he is having. He will let me know if interested. Fever blisters: Every 2 weeks, will increase Valtrex prn to qd  Also needs a dermatology referral, [place]. Will do RTC 6 months

## 2016-04-06 ENCOUNTER — Encounter: Payer: Self-pay | Admitting: Internal Medicine

## 2016-04-06 MED ORDER — ATORVASTATIN CALCIUM 20 MG PO TABS: 20.0000 mg | ORAL_TABLET | Freq: Every day | ORAL | Status: DC

## 2016-04-06 NOTE — Addendum Note (Signed)
Addended byDamita Dunnings D on: 04/06/2016 04:53 PM   Modules accepted: Orders

## 2016-04-08 MED ORDER — ESCITALOPRAM OXALATE 10 MG PO TABS: 10.0000 mg | ORAL_TABLET | Freq: Every day | ORAL | Status: DC

## 2016-04-08 NOTE — Telephone Encounter (Signed)
Send a RX: Lexapro 10 mg one by mouth daily at bedtime #30 and one refill. Patient knows  Received: Today    Colon Branch, MD  Damita Dunnings, CMA   Rx sent to [facility].

## 2016-05-06 ENCOUNTER — Encounter: Payer: Self-pay | Admitting: Internal Medicine

## 2016-05-06 ENCOUNTER — Telehealth: Payer: Self-pay | Admitting: Internal Medicine

## 2016-05-06 NOTE — Telephone Encounter (Signed)
Please see message below:  triage the patient, if he has an STD I don't think he needs to wait till the next week to be seen. JP     A few weeks ago you prescribed me lexapro and told me you would like a follow up visit from me. I have some symptoms I would like to discuss with you and hopefully we can run some test as well. I am not sure these symptoms are due to medicine or just something new. I am experiencing after urine a clear sticky discharge (possibly semen). Do you have availability next week? I'm currently on vacation this week.

## 2016-05-06 NOTE — Telephone Encounter (Signed)
LMOM for pt to return call. 

## 2016-05-07 NOTE — Telephone Encounter (Signed)
LMOM for pt to return call and MyChart message sent to pt.

## 2016-05-07 NOTE — Telephone Encounter (Signed)
thx

## 2016-05-07 NOTE — Telephone Encounter (Addendum)
Spoke w/ pt, he has experienced symptoms detailed below x2 episodes. He denies urinary pain, burning, odor, fever, hematuria, or abdominal pain. He has some mild penile discomfort, which he attributes to him milking some of the drainage from his penis after both occurrences. He reports he has been married a long time and both he and his wife are monogamous, so he has low suspicion for STI. He is on vacation in [place] until Sunday. He is planning to go to the nearest [facility], and will follow-up with PCP next week if needed.

## 2016-05-07 NOTE — Telephone Encounter (Signed)
-----   Message from Quintin Alto sent at 05/07/2016  1:10 PM EDT ----- Regarding: Returned Call Contact: [phone] Returned your call

## 2016-05-21 ENCOUNTER — Encounter: Payer: Self-pay | Admitting: Neurology

## 2016-06-03 ENCOUNTER — Other Ambulatory Visit: Payer: Self-pay

## 2016-06-03 MED ORDER — ESCITALOPRAM OXALATE 10 MG PO TABS: 10.0000 mg | ORAL_TABLET | Freq: Every day | ORAL | Status: DC

## 2016-06-09 ENCOUNTER — Encounter: Payer: Self-pay | Admitting: Internal Medicine

## 2016-06-09 MED ORDER — ATORVASTATIN CALCIUM 20 MG PO TABS: 20.0000 mg | ORAL_TABLET | Freq: Every day | ORAL | 0 refills | Status: DC

## 2016-06-12 ENCOUNTER — Encounter: Payer: Self-pay | Admitting: Internal Medicine

## 2016-06-12 ENCOUNTER — Ambulatory Visit (INDEPENDENT_AMBULATORY_CARE_PROVIDER_SITE_OTHER): Payer: BLUE CROSS/BLUE SHIELD | Admitting: Internal Medicine

## 2016-06-12 VITALS — BP 118/78 | HR 59 | Temp 97.7°F | Resp 14 | Ht 71.0 in | Wt 247.2 lb

## 2016-06-12 DIAGNOSIS — N342 Other urethritis: Secondary | ICD-10-CM | POA: Diagnosis not present

## 2016-06-12 DIAGNOSIS — Z79899 Other long term (current) drug therapy: Secondary | ICD-10-CM | POA: Diagnosis not present

## 2016-06-12 DIAGNOSIS — R569 Unspecified convulsions: Secondary | ICD-10-CM | POA: Diagnosis not present

## 2016-06-12 DIAGNOSIS — F411 Generalized anxiety disorder: Secondary | ICD-10-CM

## 2016-06-12 DIAGNOSIS — E785 Hyperlipidemia, unspecified: Secondary | ICD-10-CM

## 2016-06-12 LAB — LIPID PANEL
CHOLESTEROL: 174 mg/dL (ref 0–200)
HDL: 46.1 mg/dL (ref >39.00)
LDL Cholesterol: 116 mg/dL — ABNORMAL HIGH (ref 0–99)
NonHDL: 127.54
TRIGLYCERIDES: 57 mg/dL (ref 0.0–149.0)
Total CHOL/HDL Ratio: 4
VLDL: 11.4 mg/dL (ref 0.0–40.0)

## 2016-06-12 LAB — ALT: ALT: 48 U/L (ref 0–53)

## 2016-06-12 LAB — AST: AST: 29 U/L (ref 0–37)

## 2016-06-12 MED ORDER — FLUTICASONE PROPIONATE 50 MCG/ACT NA SUSP: 2.0000 | Freq: Every day | NASAL | 12 refills | Status: DC

## 2016-06-12 NOTE — Progress Notes (Signed)
Subjective:    Patient ID: Brandon Tyler, male    DOB: [birth year], 40 y.o.   MRN: [id number]  DOS:  06/12/2016 Type of visit - description : f/u Interval history: ---Back in June 2017 he noted few drops of clear, sticky fluid after he urinates, similar to  semen. Went to the local clinic, urine test and a ureteral swab came back negative for gonorrhea, Chlamydia or Trichomonas. as the symptoms continue on and off he went to the health department, he was check and a urine test is pending. His wife was also check recently. ---Anxiety: Since the last office visit, started Lexapro and feels really well. ---High cholesterol: On Lipitor, no side effects.  Review of Systems No dysuria, gross hematuria, genital rash No risky sexual behavior No chest pain or difficulty breathing  Past Medical History:  Diagnosis Date  . Allergic rhinitis   . Arthritis   . H/O anxiety disorder   . Palpitations 2007    saw cards, got a ECHO, told he was ok    Past Surgical History:  Procedure Laterality Date  . TONSILLECTOMY AND ADENOIDECTOMY      Social History   Social History  . Marital status: Married    Spouse name: N/A  . Number of children: 2  . 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 Smoking status: Never Smoker  . Smokeless tobacco: Never Used  . Alcohol use 0.0 oz/week     Comment: rarely   . Drug use: No     Comment: Previously used marijuana.  . Sexual activity: Not on file   Other Topics Concern  . Not on file   Social History Narrative   Lives w/ wife and 2 children  , 2009 and 2006   Right-handed.   Drinks 3 diet sodas per day.        Medication List       Accurate as of 06/12/16 11:59 PM. Always use your most recent med list.          ALPRAZolam 0.25 MG tablet Commonly known as:  XANAX Take 1 tablet (0.25 mg total) by mouth at bedtime as needed for anxiety.   atorvastatin 20  MG tablet Commonly known as:  LIPITOR Take 1 tablet (20 mg total) by mouth at bedtime.   escitalopram 10 MG tablet Commonly known as:  LEXAPRO Take 1 tablet (10 mg total) by mouth at bedtime.   fluticasone 50 MCG/ACT nasal spray Commonly known as:  FLONASE Place 2 sprays into both nostrils daily.   valACYclovir 500 MG tablet Commonly known as:  VALTREX Take 1 tablet (500 mg total) by mouth daily. FOR FEVER BLISTERS          Objective:   Physical Exam BP 118/78 (BP Location: Left Arm, Patient Position: Sitting, Cuff Size: Normal)   Pulse (!) 59   Temp 97.7 F (36.5 C) (Oral)   Resp 14   Ht 5\' 11"  (1.803 m)   Wt 247 lb 4 oz (112.2 kg)   SpO2 98%   BMI 34.48 kg/m  General:   Well developed, well nourished . NAD.  HEENT:  Normocephalic . Face symmetric, atraumatic GU: Scrotal contents normal Penis without rash, lesions, urethral meatus normal. Groins without LADs Skin: Not pale. Not jaundice Neurologic:  alert & oriented X3.  Speech normal, gait appropriate for age and unassisted Psych--  Cognition and judgment appear intact.  Cooperative with normal attention span and concentration.  Behavior appropriate. No anxious or depressed appearing.      Assessment & Plan:  Assessment: Anxiety? Seizure disorder? Saw neuro 12-2015, dx w/ SZ, Rx meds, EEG (-) 12-2015 Palpitations 2007, saw cards, got ECHO   PLAN: Urethritis? New issue . Sx are somewhat atypical, physical exam normal, already tested negative for STDs, a second set of test at the health department are pending. He has been HIV negative twice, last 2 months ago. At this point recommend observation, wait for the health department results. Seizure disorder? Back in May he decided to take Lexapro for anxiety, since then, events are not nearly as noticeable as before Anxiety: See previous entry, decided to take Lexapro, feeling well, refill medications. High cholesterol: Based on last FLP, started Lipitor 03/2016.  Labs, refills. RTC 6 months

## 2016-06-12 NOTE — Patient Instructions (Signed)
GO TO THE LAB : Get the blood work     GO TO THE FRONT DESK Schedule your next appointment for a  Check up in 6 months  Continue the same medicines, we are sending a refill

## 2016-06-12 NOTE — Progress Notes (Signed)
Pre visit review using our clinic review tool, if applicable. No additional management support is needed unless otherwise documented below in the visit note. 

## 2016-06-14 DIAGNOSIS — E785 Hyperlipidemia, unspecified: Secondary | ICD-10-CM | POA: Insufficient documentation

## 2016-06-14 NOTE — Assessment & Plan Note (Signed)
Urethritis? New issue . Sx are somewhat atypical, physical exam normal, already tested negative for STDs, a second set of test at the health department are pending. He has been HIV negative twice, last 2 months ago. At this point recommend observation, wait for the health department results. Seizure disorder? Back in May he decided to take Lexapro for anxiety, since then, events are not nearly as noticeable as before Anxiety: See previous entry, decided to take Lexapro, feeling well, refill medications. High cholesterol: Based on last FLP, started Lipitor 03/2016. Labs, refills. RTC 6 months

## 2016-06-15 ENCOUNTER — Ambulatory Visit: Payer: BLUE CROSS/BLUE SHIELD | Admitting: Neurology

## 2016-06-25 ENCOUNTER — Telehealth: Payer: Self-pay

## 2016-06-25 NOTE — Telephone Encounter (Signed)
UDS: 06/12/2016  Negative for Alprazolam: PRN  Low risk per Dr. Larose Kells 06/25/2016

## 2016-06-29 DIAGNOSIS — B078 Other viral warts: Secondary | ICD-10-CM | POA: Diagnosis not present

## 2016-06-29 DIAGNOSIS — L820 Inflamed seborrheic keratosis: Secondary | ICD-10-CM | POA: Diagnosis not present

## 2016-09-03 ENCOUNTER — Other Ambulatory Visit: Payer: Self-pay | Admitting: Internal Medicine

## 2016-10-05 ENCOUNTER — Ambulatory Visit: Payer: BLUE CROSS/BLUE SHIELD | Admitting: Internal Medicine

## 2016-12-10 ENCOUNTER — Other Ambulatory Visit: Payer: Self-pay | Admitting: Internal Medicine

## 2016-12-11 ENCOUNTER — Other Ambulatory Visit: Payer: Self-pay | Admitting: Internal Medicine

## 2016-12-14 ENCOUNTER — Ambulatory Visit (INDEPENDENT_AMBULATORY_CARE_PROVIDER_SITE_OTHER): Payer: BLUE CROSS/BLUE SHIELD | Admitting: Internal Medicine

## 2016-12-14 ENCOUNTER — Telehealth: Payer: Self-pay

## 2016-12-14 ENCOUNTER — Encounter: Payer: Self-pay | Admitting: Internal Medicine

## 2016-12-14 VITALS — BP 126/74 | HR 82 | Temp 97.8°F | Resp 14 | Ht 71.0 in | Wt 261.1 lb

## 2016-12-14 DIAGNOSIS — R569 Unspecified convulsions: Secondary | ICD-10-CM | POA: Diagnosis not present

## 2016-12-14 DIAGNOSIS — F411 Generalized anxiety disorder: Secondary | ICD-10-CM | POA: Diagnosis not present

## 2016-12-14 DIAGNOSIS — E785 Hyperlipidemia, unspecified: Secondary | ICD-10-CM | POA: Diagnosis not present

## 2016-12-14 MED ORDER — ESCITALOPRAM OXALATE 10 MG PO TABS: 10.0000 mg | ORAL_TABLET | Freq: Every day | ORAL | 1 refills | Status: DC

## 2016-12-14 NOTE — Patient Instructions (Signed)
  GO TO THE FRONT DESK Schedule your next appointment for a  Physical exam by 03-2017

## 2016-12-14 NOTE — Progress Notes (Signed)
Pre visit review using our clinic review tool, if applicable. No additional management support is needed unless otherwise documented below in the visit note. 

## 2016-12-14 NOTE — Telephone Encounter (Signed)
[facility] Results Form completed, faxed to [phone]. Copy sent for scanning and original form given back to Pt.

## 2016-12-14 NOTE — Progress Notes (Signed)
Subjective:    Patient ID: Brandon Tyler, male    DOB: July 06, 1976, 41 y.o.   MRN: [id number]  DOS:  12/14/2016 Type of visit - description : Follow-up Interval history: Anxiety: Doing great with Lexapro and Xanax as needed. Does not take Xanax every day. History of seizures: Essentially asymptomatic. Concern about his weight gain, admits there is room for improvement on his diet and exercise.   Review of Systems Denies chest or difficulty breathing No dysuria, gross hematuria. No penile discharge  (unless he is  not sexually active)  Past Medical History:  Diagnosis Date  . Allergic rhinitis   . Arthritis   . H/O anxiety disorder   . Palpitations 2007    saw cards, got a ECHO, told he was ok    Past Surgical History:  Procedure Laterality Date  . TONSILLECTOMY AND ADENOIDECTOMY      Social History   Social History  . Marital status: Married    Spouse name: N/A  . Number of children: 2  . 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 Smoking status: Never Smoker  . Smokeless tobacco: Never Used  . Alcohol use 0.0 oz/week     Comment: rarely   . Drug use: No     Comment: Previously used marijuana.  . Sexual activity: Not on file   Other Topics Concern  . Not on file   Social History Narrative   Lives w/ wife and 2 children  , 2009 and 2006   Right-handed.   Drinks 3 diet sodas per day.      Allergies as of 12/14/2016   No Known Allergies     Medication List       Accurate as of 12/14/16 11:59 PM. Always use your most recent med list.          ALPRAZolam 0.25 MG tablet Commonly known as:  XANAX Take 1 tablet (0.25 mg total) by mouth at bedtime as needed for anxiety.   atorvastatin 20 MG tablet Commonly known as:  LIPITOR Take 1 tablet (20 mg total) by mouth at bedtime.   escitalopram 10 MG tablet Commonly known as:  LEXAPRO Take 1 tablet (10 mg total) by mouth at  bedtime.   fluticasone 50 MCG/ACT nasal spray Commonly known as:  FLONASE Place 2 sprays into both nostrils daily.   valACYclovir 500 MG tablet Commonly known as:  VALTREX Take 1 tablet (500 mg total) by mouth daily. FOR FEVER BLISTERS          Objective:   Physical Exam BP 126/74 (BP Location: Left Arm, Patient Position: Sitting, Cuff Size: Normal)   Pulse 82   Temp 97.8 F (36.6 C) (Oral)   Resp 14   Ht 5\' 11"  (1.803 m)   Wt 261 lb 2 oz (118.4 kg)   SpO2 95%   BMI 36.42 kg/m  General:   Well developed, well nourished . NAD.  HEENT:  Normocephalic . Face symmetric, atraumatic Lungs:  CTA B Normal respiratory effort, no intercostal retractions, no accessory muscle use. Heart: RRR,  no murmur.  No pretibial edema bilaterally  Skin: Not pale. Not jaundice Neurologic:  alert & oriented X3.  Speech normal, gait appropriate for age and unassisted Psych--  Cognition and judgment appear intact.  Cooperative with normal attention span and concentration.  Behavior appropriate. No anxious or depressed appearing.      Assessment &  Plan:   Assessment: Hyperlipidemia Anxiety  Seizure disorder? Saw neuro 12-2015, dx w/ SZ, Rx meds, EEG (-) 12-2015 Palpitations 2007, saw cards, got ECHO Fever blisters (daily valtrex)  PLAN: Hyperlipidemia: Continue Lipitor, last FLP improved. Reassess on RTC Anxiety: Well controlled with Lexapro and Xanax as needed. RF meds Seizure disorder? Sx essentially gone after he started Lexapro. Urethritis? Now essentially asx. Weight gain: We discussed diet and exercise; wt increase could be related to SSRIs but for now he is benefiting from Lexapro. No change Also needs a letter regards a standup desk to help with his chronic knee pain (if sitting in the same position all day) RTC 03/2017

## 2016-12-15 NOTE — Assessment & Plan Note (Signed)
Hyperlipidemia: Continue Lipitor, last FLP improved. Reassess on RTC Anxiety: Well controlled with Lexapro and Xanax as needed. RF meds Seizure disorder? Sx essentially gone after he started Lexapro. Urethritis? Now essentially asx. Weight gain: We discussed diet and exercise; wt increase could be related to SSRIs but for now he is benefiting from Lexapro. No change Also needs a letter regards a standup desk to help with his chronic knee pain (if sitting in the same position all day) RTC 03/2017

## 2017-01-11 ENCOUNTER — Ambulatory Visit (INDEPENDENT_AMBULATORY_CARE_PROVIDER_SITE_OTHER): Payer: BLUE CROSS/BLUE SHIELD | Admitting: Family Medicine

## 2017-01-11 ENCOUNTER — Encounter: Payer: Self-pay | Admitting: Family Medicine

## 2017-01-11 DIAGNOSIS — M25561 Pain in right knee: Secondary | ICD-10-CM | POA: Diagnosis not present

## 2017-01-11 NOTE — Patient Instructions (Signed)
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 Do the exercises we discussed - decline squat, straight leg raises, straight leg raises with foot turned outwards, and knee extensions - 3 sets of 10 once a day. Add ankle weight if these become too easy.  Icing 15 minutes at a time as needed. Aleve 2 tabs twice a day with food as needed for pain and inflammation. Use the sleeves as you have been when up and walking around. Consider physical therapy, nitro patches if not improving - let me know if you want to do either of these. Follow up with me in 6 weeks.

## 2017-01-12 DIAGNOSIS — M25561 Pain in right knee: Secondary | ICD-10-CM | POA: Insufficient documentation

## 2017-01-12 NOTE — Assessment & Plan Note (Signed)
consistent with patellar tendinitis with patellofemoral syndrome.  Reviewed home exercises for him to do daily.  Declined PT for now.  Icing, aleve if needed.  Compression sleeves.  Consider PT, nitro patches if not improving.  F/u in 6 weeks.

## 2017-01-12 NOTE — Progress Notes (Signed)
PCP: Kathlene November, MD  Subjective:   HPI: Patient is a 41 y.o. male here for right knee pain.  Patient was seen previously for left knee pain. He reports for past 2 months he's had anterior right knee pain now. Pain level 2/10, sharp. Feels better with compression. Worse with walking, a lot of activity - will ache. No skin changes, numbness, swelling.  Past Medical History:  Diagnosis Date  . Allergic rhinitis   . Arthritis   . H/O anxiety disorder   . Palpitations 2007    saw cards, got a ECHO, told he was ok    Current Outpatient Prescriptions on File Prior to Visit  Medication Sig Dispense Refill  . ALPRAZolam (XANAX) 0.25 MG tablet Take 1 tablet (0.25 mg total) by mouth at bedtime as needed for anxiety. 30 tablet 0  . atorvastatin (LIPITOR) 20 MG tablet Take 1 tablet (20 mg total) by mouth at bedtime. 90 tablet 1  . escitalopram (LEXAPRO) 10 MG tablet Take 1 tablet (10 mg total) by mouth at bedtime. 90 tablet 1  . fluticasone (FLONASE) 50 MCG/ACT nasal spray Place 2 sprays into both nostrils daily. 16 g 12  . valACYclovir (VALTREX) 500 MG tablet Take 1 tablet (500 mg total) by mouth daily. FOR FEVER BLISTERS 30 tablet 6   No current facility-administered medications on file prior to visit.     Past Surgical History:  Procedure Laterality Date  . TONSILLECTOMY AND ADENOIDECTOMY      No Known Allergies  Social History   Social History  . Marital status: Married    Spouse name: N/A  . Number of children: 2  . 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 Smoking status: Never Smoker  . Smokeless tobacco: Never Used  . Alcohol use 0.0 oz/week     Comment: rarely   . Drug use: No     Comment: Previously used marijuana.  . Sexual activity: Not on file   Other Topics Concern  . Not on file   Social History Narrative   Lives w/ wife and 2 children  , 2009 and 2006   Right-handed.   Drinks  3 diet sodas per day.    Family History  Problem Relation Age of Onset  . Hypertension Mother   . Hyperlipidemia Mother   . Hypertension Father   . Hyperlipidemia Father   . Hyperlipidemia Maternal Grandmother   . Diabetes Other     distant relative   . CAD Other     MGM  . Prostate cancer Other     GF  . Pancreatic cancer Other     GM, uncle  . Throat cancer Other     GF  . Colon cancer Neg Hx     BP 128/90   Pulse (!) 56   Ht 5\' 11"  (1.803 m)   Wt 250 lb (113.4 kg)   BMI 34.87 kg/m   Review of Systems: See HPI above.     Objective:  Physical Exam:  Gen: NAD, comfortable in exam room  Right knee: Mod overpronation long arches No gross deformity, ecchymoses, effusion. TTP mildly patellar tendon.  No joint line, other tenderness. FROM. Negative ant/post drawers. Negative valgus/varus testing. Negative lachmanns. Negative mcmurrays, apleys, patellar apprehension. NV intact distally.  Left knee: FROM without pain.   Assessment & Plan:  1. Right knee pain - consistent with patellar tendinitis with patellofemoral syndrome.  Reviewed home exercises for him to do daily.  Declined PT for now.  Icing, aleve if needed.  Compression sleeves.  Consider PT, nitro patches if not improving.  F/u in 6 weeks.

## 2017-03-17 ENCOUNTER — Other Ambulatory Visit: Payer: Self-pay | Admitting: Internal Medicine

## 2017-04-14 ENCOUNTER — Ambulatory Visit (INDEPENDENT_AMBULATORY_CARE_PROVIDER_SITE_OTHER): Payer: BLUE CROSS/BLUE SHIELD | Admitting: Internal Medicine

## 2017-04-14 ENCOUNTER — Encounter: Payer: Self-pay | Admitting: Internal Medicine

## 2017-04-14 VITALS — BP 116/64 | HR 68 | Temp 98.1°F | Resp 14 | Ht 71.0 in | Wt 266.1 lb

## 2017-04-14 DIAGNOSIS — Z114 Encounter for screening for human immunodeficiency virus [HIV]: Secondary | ICD-10-CM

## 2017-04-14 DIAGNOSIS — E785 Hyperlipidemia, unspecified: Secondary | ICD-10-CM | POA: Diagnosis not present

## 2017-04-14 DIAGNOSIS — Z0000 Encounter for general adult medical examination without abnormal findings: Secondary | ICD-10-CM

## 2017-04-14 DIAGNOSIS — F419 Anxiety disorder, unspecified: Secondary | ICD-10-CM | POA: Diagnosis not present

## 2017-04-14 DIAGNOSIS — R739 Hyperglycemia, unspecified: Secondary | ICD-10-CM

## 2017-04-14 NOTE — Assessment & Plan Note (Addendum)
--  Tdap 2016 --He is quite concerned about his weight, we had a long discussion about diet and exercise. He admits that does not eat healthy, they don't cook at home, meals are mostly fast food, eats ice cream every day etc. Recommend to focus on a healthier diet, recommend to talk with the wellness clinic, increase his physical activity. --Labs (likes "everything checked"): CMP, CBC, FLP, TSH, HIV, A1c

## 2017-04-14 NOTE — Progress Notes (Signed)
Subjective:    Patient ID: Brandon Tyler, male    DOB: 03/27/1976, 41 y.o.   MRN: [id number]  DOS:  04/14/2017 Type of visit - description :  cpx Interval history: In general doing well, he is concerned about his weight.   Wt Readings from Last 3 Encounters:  04/14/17 266 lb 2 oz (120.7 kg)  01/11/17 250 lb (113.4 kg)  12/14/16 261 lb 2 oz (118.4 kg)    Review of Systems No other concerns, no recent seizures activities, anxiety well controlled, hardly ever takes Xanax   Other than above, a 14 point review of systems is negative     Past Medical History:  Diagnosis Date  . Allergic rhinitis   . Arthritis   . H/O anxiety disorder   . Hyperlipidemia   . Palpitations 2007    saw cards, got a ECHO, told he was ok    Past Surgical History:  Procedure Laterality Date  . TONSILLECTOMY AND ADENOIDECTOMY      Social History   Social History  . Marital status: Married    Spouse name: N/A  . Number of children: 2  . 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 Smoking status: Never Smoker  . Smokeless tobacco: Never Used  . Alcohol use 0.0 oz/week     Comment: rarely   . Drug use: No     Comment: Previously used marijuana.  . Sexual activity: Not on file   Other Topics Concern  . Not on file   Social History Narrative   Lives w/ wife and 2 children  , 2009 and 2006   Right-handed.   Drinks 3 diet sodas per day.     Family History  Problem Relation Age of Onset  . Hypertension Mother   . Hyperlipidemia Mother   . Hypertension Father   . Hyperlipidemia Father   . Hyperlipidemia Maternal Grandmother   . Diabetes Other        distant relative   . CAD Other        MGM  . Prostate cancer Other        GF, passed in his late 76s  . Pancreatic cancer Other        GM, uncle  . Throat cancer Other        GF  . Colon cancer Neg Hx      Allergies as of 04/14/2017   No Known  Allergies     Medication List       Accurate as of 04/14/17 11:59 PM. Always use your most recent med list.          ALPRAZolam 0.25 MG tablet Commonly known as:  XANAX Take 1 tablet (0.25 mg total) by mouth at bedtime as needed for anxiety.   atorvastatin 20 MG tablet Commonly known as:  LIPITOR Take 1 tablet (20 mg total) by mouth at bedtime.   escitalopram 10 MG tablet Commonly known as:  LEXAPRO Take 5 mg by mouth daily.   fluticasone 50 MCG/ACT nasal spray Commonly known as:  FLONASE Place 2 sprays into both nostrils daily.   valACYclovir 500 MG tablet Commonly known as:  VALTREX Take 1 tablet (500 mg total) by mouth daily. FOR FEVER BLISTERS          Objective:   Physical Exam BP 116/64 (BP Location: Left Arm, Patient Position: Sitting, Cuff Size: Normal)   Pulse  68   Temp 98.1 F (36.7 C) (Oral)   Resp 14   Ht 5\' 11"  (1.803 m)   Wt 266 lb 2 oz (120.7 kg)   SpO2 97%   BMI 37.12 kg/m   General:   Well developed, well nourished . NAD.  Neck: No  thyromegaly  HEENT:  Normocephalic . Face symmetric, atraumatic Lungs:  CTA B Normal respiratory effort, no intercostal retractions, no accessory muscle use. Heart: RRR,  no murmur.  No pretibial edema bilaterally  Abdomen:  Not distended, soft, non-tender. No rebound or rigidity.   Skin: Exposed areas without rash. Not pale. Not jaundice Neurologic:  alert & oriented X3.  Speech normal, gait appropriate for age and unassisted Strength symmetric and appropriate for age.  Psych: Cognition and judgment appear intact.  Cooperative with normal attention span and concentration.  Behavior appropriate. No anxious or depressed appearing.    Assessment & Plan:   Assessment: Hyperlipidemia Anxiety  Seizure disorder? Saw neuro 12-2015, dx w/ SZ, Rx meds, EEG (-) 12-2015 Palpitations 2007, saw cards, got ECHO Fever blisters (daily valtrex)  PLAN: Hyperlipidemia: Continue Lipitor, checking labs Anxiety:  Currently well controlled with Lexapro, hardly ever takes Xanax. He is struggling with his weight, will decrease Lexapro to 5 mg daily, hopefully that will help loosing weight. Hyperglycemia:  A1c slightly elevated, recheck RTC 6 months

## 2017-04-14 NOTE — Patient Instructions (Signed)
   GO TO THE FRONT DESK Schedule labs to be done within the next few days, fasting.  Schedule your next appointment for a  checkup in 6 months   Testicular Self-Exam A self-examination of your testicles (testicular self-exam) involves looking at and feeling your testicles for abnormal lumps or swelling. Several things can cause swelling, lumps, or pain in your testicles. Some of these causes are:  Injuries.  Inflammation.  Infection.  Buildup of fluids around your testicle (hydrocele).  Twisted testicles (testicular torsion).  Testicular cancer. Why is it important to do a testicular self-exam? Self-examination of the testicles and the left and right groin areas may be recommended if you are at risk for testicular cancer. Your groin is where your lower abdomen meets your upper thighs. You may be at risk for testicular cancer if you have:  An undescended testicle (cryptorchidism).  A history of previous testicular cancer.  A family history of testicular cancer. How to do a testicular self-exam The testicles are easiest to examine after a warm bath or shower. They are more difficult to examine when you are cold. This is because the muscles attached to the testicles retract and pull them up higher or into the abdomen. A normal testicle is egg-shaped and feels firm. It is smooth and not tender. The spermatic cord can be felt as a firm, spaghetti-like cord at the back of your testicle. Look and feel for changes   Stand and hold your penis away from your body.  Look at each testicle to check for lumps or swelling.  Roll each testicle between your thumb and forefinger, feeling the entire testicle. Feel for:  Lumps.  Swelling.  Discomfort.  Check the groin area between your abdomen and upper thighs on both sides of your body. Look and feel for any swelling or bumps that are tender. These could be enlarged lymph nodes. Contact a health care provider if:  You find any bumps or  lumps, such as a small, hard, pea-sized lump.  You find swelling, pain, or soreness.  You see or feel any other changes in your testicles. Summary  A self-examination of your testicles (testicular self-exam) involves looking at and feeling your testicles for any changes.  Self-examination of the testicles and the left and right groin areas may be recommended if you are at risk for testicular cancer.  You should check each of your testicles for lumps, swelling, or discomfort.  You should check for swelling or tender bumps in your groin area between your lower abdomen and upper thighs. This information is not intended to replace advice given to you by your health care provider. Make sure you discuss any questions you have with your health care provider. Document Released: 02/08/2001 Document Revised: 09/28/2016 Document Reviewed: 09/28/2016 Elsevier Interactive Patient Education  2017 [employer].

## 2017-04-14 NOTE — Progress Notes (Signed)
Pre visit review using our clinic review tool, if applicable. No additional management support is needed unless otherwise documented below in the visit note. 

## 2017-04-15 NOTE — Assessment & Plan Note (Signed)
Hyperlipidemia: Continue Lipitor, checking labs Anxiety: Currently well controlled with Lexapro, hardly ever takes Xanax. He is struggling with his weight, will decrease Lexapro to 5 mg daily, hopefully that will help loosing weight. Hyperglycemia:  A1c slightly elevated, recheck RTC 6 months

## 2017-04-21 ENCOUNTER — Other Ambulatory Visit (INDEPENDENT_AMBULATORY_CARE_PROVIDER_SITE_OTHER): Payer: BLUE CROSS/BLUE SHIELD

## 2017-04-21 ENCOUNTER — Other Ambulatory Visit (HOSPITAL_COMMUNITY)
Admission: RE | Admit: 2017-04-21 | Discharge: 2017-04-21 | Disposition: A | Discharge status: Home / Self Care | Payer: BLUE CROSS/BLUE SHIELD | Source: Ambulatory Visit | Attending: Internal Medicine | Admitting: Internal Medicine

## 2017-04-21 DIAGNOSIS — Z114 Encounter for screening for human immunodeficiency virus [HIV]: Secondary | ICD-10-CM | POA: Insufficient documentation

## 2017-04-21 DIAGNOSIS — Z0000 Encounter for general adult medical examination without abnormal findings: Secondary | ICD-10-CM | POA: Insufficient documentation

## 2017-04-21 LAB — COMPREHENSIVE METABOLIC PANEL
ALBUMIN: 4.5 g/dL (ref 3.5–5.2)
ALK PHOS: 35 U/L — AB (ref 39–117)
ALT: 31 U/L (ref 0–53)
AST: 23 U/L (ref 0–37)
BILIRUBIN TOTAL: 0.5 mg/dL (ref 0.2–1.2)
BUN: 16 mg/dL (ref 6–23)
CALCIUM: 9.7 mg/dL (ref 8.4–10.5)
CO2: 26 mEq/L (ref 19–32)
Chloride: 103 mEq/L (ref 96–112)
Creatinine, Ser: 1.22 mg/dL (ref 0.40–1.50)
GFR: 84.9 mL/min (ref >60.00)
GLUCOSE: 115 mg/dL — AB (ref 70–99)
Potassium: 4.2 mEq/L (ref 3.5–5.1)
Sodium: 137 mEq/L (ref 135–145)
TOTAL PROTEIN: 7.4 g/dL (ref 6.0–8.3)

## 2017-04-21 LAB — CBC WITH DIFFERENTIAL/PLATELET
BASOS ABS: 0 10*3/uL (ref 0.0–0.1)
BASOS PCT: 1 % (ref 0.0–3.0)
EOS ABS: 0.1 10*3/uL (ref 0.0–0.7)
Eosinophils Relative: 3.8 % (ref 0.0–5.0)
HEMATOCRIT: 44.4 % (ref 39.0–52.0)
Hemoglobin: 15.4 g/dL (ref 13.0–17.0)
LYMPHS ABS: 1.3 10*3/uL (ref 0.7–4.0)
Lymphocytes Relative: 33.2 % (ref 12.0–46.0)
MCHC: 34.7 g/dL (ref 30.0–36.0)
MCV: 91.6 fl (ref 78.0–100.0)
MONO ABS: 0.4 10*3/uL (ref 0.1–1.0)
Monocytes Relative: 10.9 % (ref 3.0–12.0)
NEUTROS ABS: 2 10*3/uL (ref 1.4–7.7)
NEUTROS PCT: 51.1 % (ref 43.0–77.0)
PLATELETS: 187 10*3/uL (ref 150.0–400.0)
RBC: 4.85 Mil/uL (ref 4.22–5.81)
RDW: 13.4 % (ref 11.5–15.5)
WBC: 3.9 10*3/uL — ABNORMAL LOW (ref 4.0–10.5)

## 2017-04-21 LAB — LIPID PANEL
CHOL/HDL RATIO: 5
CHOLESTEROL: 178 mg/dL (ref 0–200)
HDL: 39.3 mg/dL (ref >39.00)
LDL CALC: 120 mg/dL — AB (ref 0–99)
NonHDL: 139.04
Triglycerides: 95 mg/dL (ref 0.0–149.0)
VLDL: 19 mg/dL (ref 0.0–40.0)

## 2017-04-21 LAB — HEMOGLOBIN A1C: Hgb A1c MFr Bld: 6.3 % (ref 4.6–6.5)

## 2017-04-21 LAB — TSH: TSH: 1.15 u[IU]/mL (ref 0.35–4.50)

## 2017-04-21 LAB — HIV ANTIBODY (ROUTINE TESTING W REFLEX): HIV 1&2 Ab, 4th Generation: NONREACTIVE

## 2017-04-22 LAB — RPR

## 2017-04-22 LAB — HSV(HERPES SIMPLEX VRS) I + II AB-IGG
HSV 1 Glycoprotein G Ab, IgG: 34.3 Index — ABNORMAL HIGH (ref <0.90)
HSV 2 Glycoprotein G Ab, IgG: <0.9 Index (ref <0.90)

## 2017-04-23 LAB — URINE CYTOLOGY ANCILLARY ONLY
Chlamydia: NEGATIVE
Neisseria Gonorrhea: NEGATIVE

## 2017-04-24 LAB — HSV 1/2 AB (IGM), IFA W/RFLX TITER
HSV 1 IgM Screen: NEGATIVE
HSV 2 IGM SCREEN: NEGATIVE

## 2017-06-21 ENCOUNTER — Other Ambulatory Visit: Payer: Self-pay | Admitting: Internal Medicine

## 2017-06-21 NOTE — Telephone Encounter (Signed)
Okay 30 and 2 refills 

## 2017-06-21 NOTE — Telephone Encounter (Signed)
Rx printed, awaiting MD signature.  

## 2017-06-21 NOTE — Telephone Encounter (Signed)
Pt is requesting refill on alprazolam 0.25mg .  Last OV: 04/14/2017 Last Fill: 04/03/2016 #30 and 1RF UDS: 06/11/2016 Low risk   Controlled Substance database printed; no issues noted.  Please advise.

## 2017-06-21 NOTE — Telephone Encounter (Signed)
Rx faxed to [facility] pharmacy.  

## 2017-10-18 ENCOUNTER — Encounter: Payer: Self-pay | Admitting: Internal Medicine

## 2017-10-18 ENCOUNTER — Ambulatory Visit: Payer: BLUE CROSS/BLUE SHIELD | Admitting: Internal Medicine

## 2017-10-18 VITALS — BP 132/80 | HR 60 | Temp 97.8°F | Resp 14 | Ht 71.0 in | Wt 271.1 lb

## 2017-10-18 DIAGNOSIS — F411 Generalized anxiety disorder: Secondary | ICD-10-CM | POA: Diagnosis not present

## 2017-10-18 DIAGNOSIS — R635 Abnormal weight gain: Secondary | ICD-10-CM

## 2017-10-18 DIAGNOSIS — R739 Hyperglycemia, unspecified: Secondary | ICD-10-CM | POA: Diagnosis not present

## 2017-10-18 DIAGNOSIS — R0683 Snoring: Secondary | ICD-10-CM | POA: Diagnosis not present

## 2017-10-18 NOTE — Patient Instructions (Signed)
GO TO THE LAB : Get the blood work     GO TO THE FRONT DESK Schedule your next appointment for a   checkup in 4 months  

## 2017-10-18 NOTE — Progress Notes (Signed)
Subjective:    Patient ID: Brandon Tyler, male    DOB: 14-Dec-1975, 41 y.o.   MRN: [id number]  DOS:  10/18/2017 Type of visit - description : f/u Interval history: Since the last visit, we decreased Lexapro trying to help him lose weight.  He has not been successful. Reports that he has been unmotivated to go to the gym, in part because several months ago had a panic attack there and did not like to go back. Currently anxiety is well controlled. Trying to eat healthier for a while and was able to lose a few pounds but then he gained "all the weight back".  Wt Readings from Last 3 Encounters:  10/18/17 271 lb 2 oz (123 kg)  04/14/17 266 lb 2 oz (120.7 kg)  01/11/17 250 lb (113.4 kg)     Review of Systems When asked, admits to snoring, decreased energy and he also has decreased libido.  Past Medical History:  Diagnosis Date  . Allergic rhinitis   . Arthritis   . H/O anxiety disorder   . Hyperlipidemia   . Palpitations 2007    saw cards, got a ECHO, told he was ok    Past Surgical History:  Procedure Laterality Date  . TONSILLECTOMY AND ADENOIDECTOMY      Social History   Socioeconomic History  . Marital status: Married    Spouse name: Not on file  . Number of children: 2  . Years of education: 40  . Highest education level: Not on file  Social Needs  . Financial resource strain: Not on file  . Food insecurity - worry: Not on file  . Food insecurity - inability: Not on file  . Transportation needs - medical: Not on file  . Transportation needs - non-medical: Not on file  Occupational History  . Occupation: service specialist    Employer: [employer]  Tobacco Use  . Smoking status: Never Smoker  . Smokeless tobacco: Never Used  Substance and Sexual Activity  . Alcohol use: Yes    Alcohol/week: 0.0 oz    Comment: rarely   . Drug use: No    Comment: Previously used marijuana.  . Sexual activity: Not on file  Other Topics Concern  . Not on file  Social  History Narrative   Lives w/ wife and 2 children  , 2009 and 2006   Right-handed.   Drinks 3 diet sodas per day.      Allergies as of 10/18/2017   No Known Allergies     Medication List        Accurate as of 10/18/17 11:59 PM. Always use your most recent med list.          ALPRAZolam 0.25 MG tablet Commonly known as:  XANAX Take 1 tablet (0.25 mg total) by mouth at bedtime as needed for anxiety.   atorvastatin 20 MG tablet Commonly known as:  LIPITOR Take 1 tablet (20 mg total) by mouth at bedtime.   fluticasone 50 MCG/ACT nasal spray Commonly known as:  FLONASE Place 2 sprays into both nostrils daily as needed for allergies or rhinitis.   valACYclovir 500 MG tablet Commonly known as:  VALTREX Take 1 tablet (500 mg total) by mouth daily.          Objective:   Physical Exam BP 132/80 (BP Location: Left Arm, Patient Position: Sitting, Cuff Size: Normal)   Pulse 60   Temp 97.8 F (36.6 C) (Oral)   Resp 14   Ht  5\' 11"  (1.803 m)   Wt 271 lb 2 oz (123 kg)   SpO2 98%   BMI 37.81 kg/m  General:   Well developed, well nourished . NAD.  HEENT:  Normocephalic . Face symmetric, atraumatic Neurologic:  alert & oriented X3.  Speech normal, gait appropriate for age and unassisted Psych--  Cognition and judgment appear intact.  Cooperative with normal attention span and concentration.  Behavior appropriate. No anxious or depressed appearing.      Assessment & Plan:   Assessment: Prediabetes (a1c: 6.3   04/2017) Hyperlipidemia.  Anxiety  Seizure disorder? Saw neuro 12-2015, dx w/ SZ, Rx meds, EEG (-) 12-2015 Palpitations 2007, saw cards, got ECHO Fever blisters (daily valtrex)  PLAN: Prediabetes: Last A1c discussed, planning to change his lifestyle, see below Anxiety: See previous entries, currently on Lexapro 5 mg, sxs controlled but he feels  unmotivated to go to the gym,  thus is difficulty for him losing weight.  We agreed to stop Lexapro (might causing him  to feel "flat" emotionally thus unmotivated),  continue Xanax  and see a counselor. Weight gain: Significant weight gain lately, will stop Lexapro completely, encouraged to go back to the gym and work on his diet.  Again asked to consider see a bariatric doctor Snoring: Started to snore once he gained weight.  Also feeling fatigue and decreased libido.  Reassess all symptoms once he starts to lose weight. Primary CARE: Declined a flu shot, form for work faxed RTC 4 months

## 2017-10-18 NOTE — Progress Notes (Signed)
Pre visit review using our clinic review tool, if applicable. No additional management support is needed unless otherwise documented below in the visit note. 

## 2017-10-19 ENCOUNTER — Encounter: Payer: Self-pay | Admitting: Internal Medicine

## 2017-10-19 LAB — HEMOGLOBIN A1C: HEMOGLOBIN A1C: 6.4 % (ref 4.6–6.5)

## 2017-10-19 NOTE — Assessment & Plan Note (Signed)
Prediabetes: Last A1c discussed, planning to change his lifestyle, see below Anxiety: See previous entries, currently on Lexapro 5 mg, sxs controlled but he feels  unmotivated to go to the gym,  thus is difficulty for him losing weight.  We agreed to stop Lexapro (might causing him to feel "flat" emotionally thus unmotivated),  continue Xanax  and see a counselor. Weight gain: Significant weight gain lately, will stop Lexapro completely, encouraged to go back to the gym and work on his diet.  Again asked to consider see a bariatric doctor Snoring: Started to snore once he gained weight.  Also feeling fatigue and decreased libido.  Reassess all symptoms once he starts to lose weight. Primary CARE: Declined a flu shot, form for work faxed RTC 4 months

## 2017-10-20 LAB — PAIN MGMT, PROFILE 8 W/CONF, U
6 Acetylmorphine: NEGATIVE ng/mL (ref <10)
AMPHETAMINES: NEGATIVE ng/mL (ref <500)
Alcohol Metabolites: NEGATIVE ng/mL (ref <500)
Benzodiazepines: NEGATIVE ng/mL (ref <100)
Buprenorphine, Urine: NEGATIVE ng/mL (ref <5)
Cocaine Metabolite: NEGATIVE ng/mL (ref <150)
Creatinine: 184.7 mg/dL
MDMA: NEGATIVE ng/mL (ref <500)
Marijuana Metabolite: NEGATIVE ng/mL (ref <20)
OXIDANT: NEGATIVE ug/mL (ref <200)
OXYCODONE: NEGATIVE ng/mL (ref <100)
Opiates: NEGATIVE ng/mL (ref <100)
pH: 6.18 (ref 4.5–9.0)

## 2017-12-23 DIAGNOSIS — R635 Abnormal weight gain: Secondary | ICD-10-CM | POA: Diagnosis not present

## 2017-12-23 DIAGNOSIS — E291 Testicular hypofunction: Secondary | ICD-10-CM | POA: Diagnosis not present

## 2017-12-27 DIAGNOSIS — E669 Obesity, unspecified: Secondary | ICD-10-CM | POA: Diagnosis not present

## 2017-12-27 DIAGNOSIS — E291 Testicular hypofunction: Secondary | ICD-10-CM | POA: Diagnosis not present

## 2017-12-27 DIAGNOSIS — R6882 Decreased libido: Secondary | ICD-10-CM | POA: Diagnosis not present

## 2017-12-27 DIAGNOSIS — E782 Mixed hyperlipidemia: Secondary | ICD-10-CM | POA: Diagnosis not present

## 2018-01-12 ENCOUNTER — Encounter (INDEPENDENT_AMBULATORY_CARE_PROVIDER_SITE_OTHER): Payer: BLUE CROSS/BLUE SHIELD

## 2018-01-18 ENCOUNTER — Encounter: Payer: Self-pay | Admitting: Internal Medicine

## 2018-01-20 NOTE — Telephone Encounter (Signed)
Called pt and LVM advising to call back and set up an appt with Dr. Larose Kells regarding his weight.

## 2018-01-20 NOTE — Telephone Encounter (Signed)
Opened in error

## 2018-01-20 NOTE — Telephone Encounter (Signed)
Can you reach out to Pt- schedule 40 minute appt w/ PCP to discuss weight loss meds.

## 2018-01-21 NOTE — Telephone Encounter (Signed)
Called pt and LVM to call and schedule an appt to discuss his weight loss goals and medication.

## 2018-01-24 DIAGNOSIS — R4586 Emotional lability: Secondary | ICD-10-CM | POA: Diagnosis not present

## 2018-01-24 DIAGNOSIS — E291 Testicular hypofunction: Secondary | ICD-10-CM | POA: Diagnosis not present

## 2018-01-24 DIAGNOSIS — R6882 Decreased libido: Secondary | ICD-10-CM | POA: Diagnosis not present

## 2018-01-24 DIAGNOSIS — R5383 Other fatigue: Secondary | ICD-10-CM | POA: Diagnosis not present

## 2018-01-25 DIAGNOSIS — R5383 Other fatigue: Secondary | ICD-10-CM | POA: Diagnosis not present

## 2018-01-25 DIAGNOSIS — G479 Sleep disorder, unspecified: Secondary | ICD-10-CM | POA: Diagnosis not present

## 2018-01-25 DIAGNOSIS — R6882 Decreased libido: Secondary | ICD-10-CM | POA: Diagnosis not present

## 2018-01-25 DIAGNOSIS — R4586 Emotional lability: Secondary | ICD-10-CM | POA: Diagnosis not present

## 2018-01-29 ENCOUNTER — Other Ambulatory Visit: Payer: Self-pay | Admitting: Internal Medicine

## 2018-01-31 DIAGNOSIS — E291 Testicular hypofunction: Secondary | ICD-10-CM | POA: Diagnosis not present

## 2018-01-31 DIAGNOSIS — R7303 Prediabetes: Secondary | ICD-10-CM | POA: Diagnosis not present

## 2018-02-14 DIAGNOSIS — R7303 Prediabetes: Secondary | ICD-10-CM | POA: Diagnosis not present

## 2018-02-14 DIAGNOSIS — E782 Mixed hyperlipidemia: Secondary | ICD-10-CM | POA: Diagnosis not present

## 2018-02-14 DIAGNOSIS — E291 Testicular hypofunction: Secondary | ICD-10-CM | POA: Diagnosis not present

## 2018-02-16 ENCOUNTER — Ambulatory Visit (INDEPENDENT_AMBULATORY_CARE_PROVIDER_SITE_OTHER): Payer: BLUE CROSS/BLUE SHIELD | Admitting: Internal Medicine

## 2018-02-16 ENCOUNTER — Encounter: Payer: Self-pay | Admitting: Internal Medicine

## 2018-02-16 VITALS — BP 130/82 | HR 77 | Temp 97.9°F | Resp 14 | Ht 71.0 in | Wt 260.5 lb

## 2018-02-16 DIAGNOSIS — F411 Generalized anxiety disorder: Secondary | ICD-10-CM

## 2018-02-16 DIAGNOSIS — R5383 Other fatigue: Secondary | ICD-10-CM | POA: Diagnosis not present

## 2018-02-16 DIAGNOSIS — R7303 Prediabetes: Secondary | ICD-10-CM | POA: Diagnosis not present

## 2018-02-16 MED ORDER — AZELASTINE HCL 0.1 % NA SOLN: 2.0000 | Freq: Two times a day (BID) | NASAL | 6 refills | Status: DC

## 2018-02-16 NOTE — Patient Instructions (Signed)
GO TO THE FRONT DESK Schedule your next appointment for a physical exam in 4 months  For sore throat: Use the nose sprays and call me if you are not better Flonase 2 sprays on each side of the nose every day Astelin, see prescription: 2 sprays on each side of the nose twice a day.

## 2018-02-16 NOTE — Progress Notes (Signed)
Subjective:    Patient ID: Brandon Tyler, male    DOB: 05/18/1976, 42 y.o.   MRN: [id number]  DOS:  02/16/2018 Type of visit - description : f/u  Interval history: Since the last office visit is feeling well. He tried to eat healthier and go to the gym and that did not help his weight. He went to another clinic, in addition to difficulty with weight gain he complained of tiredness and low libido. Blood work was done, He was started on testosterone supplements and he feels overall better. Despite his efforts, he was unable to lose weight so he started phentermine 2 weeks ago and has lost already 6 pounds.  Also complaining of cold 3 weeks ago, at the time did not have fever chills.  He did have some cough. He reports ongoing sore throat since then.  Wt Readings from Last 3 Encounters:  02/16/18 260 lb 8 oz (118.2 kg)  10/18/17 271 lb 2 oz (123 kg)  04/14/17 266 lb 2 oz (120.7 kg)     Review of Systems   Past Medical History:  Diagnosis Date  . Allergic rhinitis   . Arthritis   . H/O anxiety disorder   . Hyperlipidemia   . Palpitations 2007    saw cards, got a ECHO, told he was ok    Past Surgical History:  Procedure Laterality Date  . TONSILLECTOMY AND ADENOIDECTOMY      Social History   Socioeconomic History  . Marital status: Married    Spouse name: Not on file  . Number of children: 2  . Years of education: 5  . Highest education level: Not on file  Occupational History  . Occupation: Armed forces training and education officer: [employer]  . Financial resource strain: Not on file  . Food insecurity:    Worry: Not on file    Inability: Not on file  . Transportation needs:    Medical: Not on file    Non-medical: Not on file  Tobacco Use  . Smoking status: Never Smoker  . Smokeless tobacco: Never Used  Substance and Sexual Activity  . Alcohol use: Yes    Alcohol/week: 0.0 oz    Comment: rarely   . Drug use: No    Comment: Previously used  marijuana.  . Sexual activity: Not on file  Lifestyle  . Physical activity:    Days per week: Not on file    Minutes per session: Not on file  . Stress: Not on file  Relationships  . Social connections:    Talks on phone: Not on file    Gets together: Not on file    Attends religious service: Not on file    Active member of club or organization: Not on file    Attends meetings of clubs or organizations: Not on file    Relationship status: Not on file  . Intimate partner violence:    Fear of current or ex partner: Not on file    Emotionally abused: Not on file    Physically abused: Not on file    Forced sexual activity: Not on file  Other Topics Concern  . Not on file  Social History Narrative   Lives w/ wife and 2 children  , 2009 and 2006   Right-handed.   Drinks 3 diet sodas per day.      Allergies as of 02/16/2018   No Known Allergies     Medication List  Accurate as of 02/16/18  4:43 PM. Always use your most recent med list.          ALPRAZolam 0.25 MG tablet Commonly known as:  XANAX Take 1 tablet (0.25 mg total) by mouth at bedtime as needed for anxiety.   atorvastatin 20 MG tablet Commonly known as:  LIPITOR TAKE 1 TABLET(20 MG) BY MOUTH AT BEDTIME   fluticasone 50 MCG/ACT nasal spray Commonly known as:  FLONASE Place 2 sprays into both nostrils daily as needed for allergies or rhinitis.   valACYclovir 500 MG tablet Commonly known as:  VALTREX Take 1 tablet (500 mg total) by mouth daily.          Objective:   Physical Exam BP 130/82 (BP Location: Left Arm, Patient Position: Sitting, Cuff Size: Normal)   Pulse 77   Temp 97.9 F (36.6 C) (Oral)   Resp 14   Ht 5\' 11"  (1.803 m)   Wt 260 lb 8 oz (118.2 kg)   SpO2 97%   BMI 36.33 kg/m  General:   Well developed, well nourished . NAD.  HEENT:  Normocephalic . Face symmetric, atraumatic. Nose normal, sinuses no TTP.  TMs normal.  Throat symmetric, no red, large but normal-appearing tonsils  otherwise. Lungs:  CTA B Normal respiratory effort, no intercostal retractions, no accessory muscle use. Heart: RRR,  no murmur.  No pretibial edema bilaterally  Skin: Not pale. Not jaundice Neurologic:  alert & oriented X3.  Speech normal, gait appropriate for age and unassisted Psych--  Cognition and judgment appear intact.  Cooperative with normal attention span and concentration.  Behavior appropriate. No anxious or depressed appearing.      Assessment & Plan:    Assessment: Prediabetes (a1c: 6.3   04/2017) Hyperlipidemia.  Anxiety  Seizure disorder? Saw neuro 12-2015, dx w/ SZ, Rx meds, EEG (-) 12-2015 Palpitations 2007, saw cards, got ECHO Fever blisters (daily valtrex)  PLAN: Prediabetes: On diet control Anxiety: Not a major issue at this point, rarely takes Xanax Weight gain, fatigue and low libido: Since the last visit, went to a weight loss clinic, he was prescribed testosterone and feels much better as far as energy and libido is concerned.  He was not able to lose weight and  Started phentermine a couple of weeks ago.  + Weight loss since then.  Patient is warned about phentermine side effects including rebound weight gain, chest pain and palpitations, rec  to keep his doctor at the other office aware if develops s/e.  Reportedly he had labs before HRT was initiated. Throat pain: Started after a URI, exam is benign, I wonder about PND.  Recommend Astelin and Flonase. RTC 4 months, fasting CPX.

## 2018-02-16 NOTE — Progress Notes (Signed)
Pre visit review using our clinic review tool, if applicable. No additional management support is needed unless otherwise documented below in the visit note. 

## 2018-02-17 DIAGNOSIS — E119 Type 2 diabetes mellitus without complications: Secondary | ICD-10-CM | POA: Insufficient documentation

## 2018-02-17 DIAGNOSIS — R7303 Prediabetes: Secondary | ICD-10-CM | POA: Insufficient documentation

## 2018-02-17 NOTE — Assessment & Plan Note (Signed)
Prediabetes: On diet control Anxiety: Not a major issue at this point, rarely takes Xanax Weight gain, fatigue and low libido: Since the last visit, went to a weight loss clinic, he was prescribed testosterone and feels much better as far as energy and libido is concerned.  He was not able to lose weight and  Started phentermine a couple of weeks ago.  + Weight loss since then.  Patient is warned about phentermine side effects including rebound weight gain, chest pain and palpitations, rec  to keep his doctor at the other office aware if develops s/e.  Reportedly he had labs before HRT was initiated. Throat pain: Started after a URI, exam is benign, I wonder about PND.  Recommend Astelin and Flonase. RTC 4 months, fasting CPX.

## 2018-02-28 DIAGNOSIS — E291 Testicular hypofunction: Secondary | ICD-10-CM | POA: Diagnosis not present

## 2018-02-28 DIAGNOSIS — E782 Mixed hyperlipidemia: Secondary | ICD-10-CM | POA: Diagnosis not present

## 2018-03-14 DIAGNOSIS — E291 Testicular hypofunction: Secondary | ICD-10-CM | POA: Diagnosis not present

## 2018-03-21 DIAGNOSIS — G479 Sleep disorder, unspecified: Secondary | ICD-10-CM | POA: Diagnosis not present

## 2018-03-21 DIAGNOSIS — R4586 Emotional lability: Secondary | ICD-10-CM | POA: Diagnosis not present

## 2018-03-21 DIAGNOSIS — R5383 Other fatigue: Secondary | ICD-10-CM | POA: Diagnosis not present

## 2018-03-21 DIAGNOSIS — E291 Testicular hypofunction: Secondary | ICD-10-CM | POA: Diagnosis not present

## 2018-04-20 DIAGNOSIS — R5383 Other fatigue: Secondary | ICD-10-CM | POA: Diagnosis not present

## 2018-04-20 DIAGNOSIS — E291 Testicular hypofunction: Secondary | ICD-10-CM | POA: Diagnosis not present

## 2018-04-20 DIAGNOSIS — G479 Sleep disorder, unspecified: Secondary | ICD-10-CM | POA: Diagnosis not present

## 2018-04-20 DIAGNOSIS — R4586 Emotional lability: Secondary | ICD-10-CM | POA: Diagnosis not present

## 2018-04-26 DIAGNOSIS — G479 Sleep disorder, unspecified: Secondary | ICD-10-CM | POA: Diagnosis not present

## 2018-04-26 DIAGNOSIS — N529 Male erectile dysfunction, unspecified: Secondary | ICD-10-CM | POA: Diagnosis not present

## 2018-04-26 DIAGNOSIS — R5383 Other fatigue: Secondary | ICD-10-CM | POA: Diagnosis not present

## 2018-04-26 DIAGNOSIS — R4586 Emotional lability: Secondary | ICD-10-CM | POA: Diagnosis not present

## 2018-06-21 ENCOUNTER — Ambulatory Visit: Payer: BLUE CROSS/BLUE SHIELD | Admitting: Internal Medicine

## 2019-01-25 ENCOUNTER — Encounter: Payer: Self-pay | Admitting: Internal Medicine

## 2019-01-25 ENCOUNTER — Ambulatory Visit (INDEPENDENT_AMBULATORY_CARE_PROVIDER_SITE_OTHER): Payer: Federal, State, Local not specified - PPO | Admitting: Internal Medicine

## 2019-01-25 ENCOUNTER — Other Ambulatory Visit: Payer: Self-pay

## 2019-01-25 VITALS — BP 128/82 | HR 69 | Temp 97.7°F | Resp 16 | Ht 71.0 in | Wt 250.1 lb

## 2019-01-25 DIAGNOSIS — M25562 Pain in left knee: Secondary | ICD-10-CM

## 2019-01-25 DIAGNOSIS — R42 Dizziness and giddiness: Secondary | ICD-10-CM | POA: Diagnosis not present

## 2019-01-25 DIAGNOSIS — F411 Generalized anxiety disorder: Secondary | ICD-10-CM

## 2019-01-25 DIAGNOSIS — Z8619 Personal history of other infectious and parasitic diseases: Secondary | ICD-10-CM

## 2019-01-25 DIAGNOSIS — E785 Hyperlipidemia, unspecified: Secondary | ICD-10-CM

## 2019-01-25 MED ORDER — ALPRAZOLAM 0.25 MG PO TABS: 0.2500 mg | ORAL_TABLET | Freq: Every evening | ORAL | 1 refills | Status: DC | PRN

## 2019-01-25 MED ORDER — ATORVASTATIN CALCIUM 20 MG PO TABS: 20.0000 mg | ORAL_TABLET | Freq: Every day | ORAL | 1 refills | Status: DC

## 2019-01-25 MED ORDER — MECLIZINE HCL 25 MG PO CHEW: 1.0000 | CHEWABLE_TABLET | Freq: Three times a day (TID) | ORAL | 0 refills | Status: DC | PRN

## 2019-01-25 MED ORDER — VALACYCLOVIR HCL 1 G PO TABS: ORAL_TABLET | ORAL | 0 refills | Status: DC

## 2019-01-25 NOTE — Patient Instructions (Signed)
  GO TO THE FRONT DESK Schedule your next appointment   for a physical exam in 1 month, fasting   Take meclizine as needed for dizziness  If you experience severe dizziness again or you have a severe headache: Call or go to the ER

## 2019-01-25 NOTE — Progress Notes (Signed)
Subjective:    Patient ID: Brandon Tyler, male    DOB: 07-Oct-1976, 43 y.o.   MRN: [id number]  DOS:  01/25/2019 Type of visit - description: Acute visit 3 days ago, suddenly developed moderate to severe dizziness, associated with some nausea but no vomiting. Symptoms decreased but he laying down, increased when he stood up or sit up. He got a leftover meclizine from a family member, taking it with some help (?) Today he feels a slightly better  Cold sores: Needs a refill  Anxiety: Needs a refill on Xanax  High cholesterol: Off Lipitor for 2 months, did not have s/e, simply stopped the medicine.  Needs a refill.  Also, on and off problems with left knee pain and swelling, this problem has been quiet for a while but symptoms are for the first few days ago.  There was no redness or warmness around the left knee.  Review of Systems Denies fever chills No sinus pain or congestion No diarrhea or abdominal pain No neck pain no neck stiffness He had a headache around the time he started with dizziness, was not the worst of his life, he thinks is related to him not eating. No diplopia, slurred speech, or motor deficits  Past Medical History:  Diagnosis Date   Allergic rhinitis    Arthritis    H/O anxiety disorder    Hyperlipidemia    Palpitations 2007    saw cards, got a ECHO, told he was ok    Past Surgical History:  Procedure Laterality Date   TONSILLECTOMY AND ADENOIDECTOMY      Social History   Socioeconomic History   Marital status: Married    Spouse name: Not on file   Number of children: 2   Years of education: 16   Highest education level: Not on file  Occupational History   Occupation: service specialist    Employer: [employer]   Financial resource strain: Not on file   Food insecurity:    Worry: Not on file    Inability: Not on file   Transportation needs:    Medical: Not on file    Non-medical: Not on file  Tobacco Use     Smoking status: Never Smoker   Smokeless tobacco: Never Used  Substance and Sexual Activity   Alcohol use: Yes    Alcohol/week: 0.0 standard drinks    Comment: rarely    Drug use: No    Comment: Previously used marijuana.   Sexual activity: Not on file  Lifestyle   Physical activity:    Days per week: Not on file    Minutes per session: Not on file   Stress: Not on file  Relationships   Social connections:    Talks on phone: Not on file    Gets together: Not on file    Attends religious service: Not on file    Active member of club or organization: Not on file    Attends meetings of clubs or organizations: Not on file    Relationship status: Not on file   Intimate partner violence:    Fear of current or ex partner: Not on file    Emotionally abused: Not on file    Physically abused: Not on file    Forced sexual activity: Not on file  Other Topics Concern   Not on file  Social History Narrative   Lives w/ wife and 2 children  , 2009 and 2006   Right-handed.  Drinks 3 diet sodas per day.      Allergies as of 01/25/2019   No Known Allergies     Medication List       Accurate as of January 25, 2019 11:59 PM. Always use your most recent med list.        ALPRAZolam 0.25 MG tablet Commonly known as:  XANAX Take 1 tablet (0.25 mg total) by mouth at bedtime as needed for anxiety.   atorvastatin 20 MG tablet Commonly known as:  LIPITOR Take 1 tablet (20 mg total) by mouth at bedtime.   Meclizine HCl 25 MG Chew Chew 1 tablet (25 mg total) by mouth 3 (three) times daily as needed.   valACYclovir 1000 MG tablet Commonly known as:  Valtrex 2 tabs twice a day for one day for each episode of cold sores           Objective:   Physical Exam BP 128/82 (BP Location: Left Arm, Patient Position: Sitting, Cuff Size: Normal)    Pulse 69    Temp 97.7 F (36.5 C) (Oral)    Resp 16    Ht 5\' 11"  (1.803 m)    Wt 250 lb 2 oz (113.5 kg)    SpO2 98%    BMI 34.89 kg/m   General:   Well developed, NAD, BMI noted. HEENT:  Normocephalic . Face symmetric, atraumatic.Marland Kitchen  TMs normal. EOMI, pupils equal and reactive Neck: Full range of motion, no TTP at the cervical spine Lungs:  CTA B Normal respiratory effort, no intercostal retractions, no accessory muscle use. Heart: RRR,  no murmur.  No pretibial edema bilaterally  Skin: Not pale. Not jaundice Neurologic:  alert & oriented X3.  Speech normal, gait appropriate for age and unassisted DTR symmetric.  Motor symmetric, face symmetric. MSK: Right knee normal Left knee: Very small effusion noted.  Otherwise the knee is full range of motion, no red, no warmth.  Joint is a stable Psych--  Cognition and judgment appear intact.  Cooperative with normal attention span and concentration.  Behavior appropriate. No anxious or depressed appearing.      Assessment    Assessment: Prediabetes (a1c: 6.3   04/2017) Hyperlipidemia.  Anxiety  Seizure disorder? Saw neuro 12-2015, dx w/ SZ, Rx meds, EEG (-) 12-2015 Palpitations 2007, saw cards, got ECHO Fever blisters (daily valtrex)  PLAN: Dizziness: As described above, likely a peripheral issue, he is already getting better.  We agreed on continue meclizine, rest and fluids.  He will definitely call or go to the ER if symptoms are severe or associated with other issues such as a severe headache. Hyperlipidemia: Off Lipitor for 2 months, refill sent.  Reassess on RTC Anxiety: On Xanax sporadically.  RF sent today.  Contract today. Cold sores: Refilled Valtrex Knee pain: On and off symptoms, saw sports medicine before, has been asx until recently. We talk about possibly a referral back to sports medicine but he declined.  We agreed on knee sleeve, ice, judicious use of OTCs, call when ready to pursue a referral. RTC 4 weeks CPX

## 2019-01-25 NOTE — Progress Notes (Signed)
Pre visit review using our clinic review tool, if applicable. No additional management support is needed unless otherwise documented below in the visit note. 

## 2019-01-27 DIAGNOSIS — Z8619 Personal history of other infectious and parasitic diseases: Secondary | ICD-10-CM | POA: Insufficient documentation

## 2019-01-27 NOTE — Assessment & Plan Note (Signed)
Dizziness: As described above, likely a peripheral issue, he is already getting better.  We agreed on continue meclizine, rest and fluids.  He will definitely call or go to the ER if symptoms are severe or associated with other issues such as a severe headache. Hyperlipidemia: Off Lipitor for 2 months, refill sent.  Reassess on RTC Anxiety: On Xanax sporadically.  RF sent today.  Contract today. Cold sores: Refilled Valtrex Knee pain: On and off symptoms, saw sports medicine before, has been asx until recently. We talk about possibly a referral back to sports medicine but he declined.  We agreed on knee sleeve, ice, judicious use of OTCs, call when ready to pursue a referral. RTC 4 weeks CPX

## 2019-01-29 ENCOUNTER — Encounter: Payer: Self-pay | Admitting: Internal Medicine

## 2019-02-01 ENCOUNTER — Other Ambulatory Visit: Payer: Self-pay

## 2019-02-01 ENCOUNTER — Encounter: Payer: Self-pay | Admitting: Internal Medicine

## 2019-02-01 ENCOUNTER — Ambulatory Visit: Payer: Federal, State, Local not specified - PPO | Admitting: Internal Medicine

## 2019-02-01 VITALS — BP 128/60 | HR 69 | Temp 97.9°F | Resp 16 | Ht 71.0 in | Wt 248.1 lb

## 2019-02-01 DIAGNOSIS — R7303 Prediabetes: Secondary | ICD-10-CM

## 2019-02-01 DIAGNOSIS — R739 Hyperglycemia, unspecified: Secondary | ICD-10-CM

## 2019-02-01 DIAGNOSIS — R42 Dizziness and giddiness: Secondary | ICD-10-CM | POA: Diagnosis not present

## 2019-02-01 DIAGNOSIS — H814 Vertigo of central origin: Secondary | ICD-10-CM

## 2019-02-01 MED ORDER — ONDANSETRON HCL 4 MG PO TABS: 4.0000 mg | ORAL_TABLET | Freq: Three times a day (TID) | ORAL | 0 refills | Status: DC | PRN

## 2019-02-01 NOTE — Progress Notes (Signed)
Pre visit review using our clinic review tool, if applicable. No additional management support is needed unless otherwise documented below in the visit note. 

## 2019-02-01 NOTE — Telephone Encounter (Signed)
Pt coming today at 3:20pm.

## 2019-02-01 NOTE — Patient Instructions (Addendum)
GO TO THE LAB : Get the blood work     GO TO THE FRONT DESK Schedule your next appointment   For a check up in 4 weeks    ====================  [facility] [address], [place]  [phone]  MRI at 6:20pm, please arrive at 6pm.   ======================  Continue resting, stay hydrated.  Continue dimenhydrinate every 8 hours as needed  Try Zofran as needed for nausea  Take Xanax before the MRI.  Expect a phone call from the physical therapist, if they do not call you by tomorrow please let me know  ER if symptoms severe , unusual or different

## 2019-02-01 NOTE — Progress Notes (Signed)
Subjective:    Patient ID: Brandon Tyler, male    DOB: 1976/03/28, 43 y.o.   MRN: [id number]  DOS:  02/01/2019 Type of visit - description: Acute visit, worsening vertigo  Symptoms are started approximately 01/22/2019, he was seen here 01/25/2019, dizziness felt to be peripheral. After the last OV, he actually got almost 100% however on 01/28/2019, shortly after he stood up from bed the symptoms started again. This time, sxs  are severe, associated with nausea, sometimes he has been unable to stand up or drive. Symptoms are on and off, decrease when he sits quietly and close his eyes. EMS was called around 02/07/2019, vital signs were stable, paramedics stated they didn't think he was having a stroke according to the patient; he declined to go to the ER. He tried meclizine however dimenhydrinate 50 mg every 8 hours works the best for him.   Review of Systems No headache, diplopia, slurred speech, motor deficits. No tinnitus or decreased hearing.   Past Medical History:  Diagnosis Date  . Allergic rhinitis   . Arthritis   . H/O anxiety disorder   . Hyperlipidemia   . Palpitations 2007    saw cards, got a ECHO, told he was ok    Past Surgical History:  Procedure Laterality Date  . TONSILLECTOMY AND ADENOIDECTOMY      Social History   Socioeconomic History  . Marital status: Married    Spouse name: Not on file  . Number of children: 2  . Years of education: 39  . Highest education level: Not on file  Occupational History  . Occupation: Armed forces training and education officer: [employer]  . Financial resource strain: Not on file  . Food insecurity:    Worry: Not on file    Inability: Not on file  . Transportation needs:    Medical: Not on file    Non-medical: Not on file  Tobacco Use  . Smoking status: Never Smoker  . Smokeless tobacco: Never Used  Substance and Sexual Activity  . Alcohol use: Yes    Alcohol/week: 0.0 standard drinks    Comment: rarely    . Drug use: No    Comment: Previously used marijuana.  . Sexual activity: Not on file  Lifestyle  . Physical activity:    Days per week: Not on file    Minutes per session: Not on file  . Stress: Not on file  Relationships  . Social connections:    Talks on phone: Not on file    Gets together: Not on file    Attends religious service: Not on file    Active member of club or organization: Not on file    Attends meetings of clubs or organizations: Not on file    Relationship status: Not on file  . Intimate partner violence:    Fear of current or ex partner: Not on file    Emotionally abused: Not on file    Physically abused: Not on file    Forced sexual activity: Not on file  Other Topics Concern  . Not on file  Social History Narrative   Lives w/ wife and 2 children  , 2009 and 2006   Right-handed.   Drinks 3 diet sodas per day.      Allergies as of 02/01/2019   No Known Allergies     Medication List       Accurate as of February 01, 2019  3:08 PM. Always  use your most recent med list.        ALPRAZolam 0.25 MG tablet Commonly known as:  XANAX Take 1 tablet (0.25 mg total) by mouth at bedtime as needed for anxiety.   atorvastatin 20 MG tablet Commonly known as:  LIPITOR Take 1 tablet (20 mg total) by mouth at bedtime.   Meclizine HCl 25 MG Chew Chew 1 tablet (25 mg total) by mouth 3 (three) times daily as needed.   valACYclovir 1000 MG tablet Commonly known as:  Valtrex 2 tabs twice a day for one day for each episode of cold sores           Objective:   Physical Exam BP 128/60 (BP Location: Left Arm, Patient Position: Sitting, Cuff Size: Normal)   Pulse 69   Temp 97.9 F (36.6 C) (Oral)   Resp 16   Ht 5\' 11"  (1.803 m)   Wt 248 lb 2 oz (112.5 kg)   SpO2 98%   BMI 34.61 kg/m  General:   Well developed, NAD, BMI noted. HEENT:  Normocephalic . Face symmetric, atraumatic Neck: Normal carotid pulses Lungs: Next CTA B Normal respiratory effort, no  intercostal retractions, no accessory muscle use. Heart: RRR,  no murmur.  No pretibial edema bilaterally  Skin: Not pale. Not jaundice Neurologic:  alert & oriented X3.  Speech normal, gait appropriate for age and unassisted EOMI, pupils equal and reactive, no nystagmus. Motor and DTR symmetric No nystagmus Romberg absent Psych--  Cognition and judgment appear intact.  Cooperative with normal attention span and concentration.  Behavior appropriate. No anxious or depressed appearing.      Assessment     Assessment: Prediabetes (a1c: 6.3   04/2017) Hyperlipidemia.  Anxiety  Seizure disorder? Saw neuro 12-2015, dx w/ SZ, Rx meds, EEG (-) 12-2015 Palpitations 2007, saw cards, got ECHO Fever blisters (daily valtrex)  PLAN: Dizziness: As described above neurological exam remains normal. Although symptoms are likely peripheral, they are persistent and severe.  He has been unable to walk at times or drive. Plan: Checking a CMP, CBC, TSH MRI brain with and without.  Previously had difficulties with claustrophobia, he has some Xanax on hand. I spoke with physical therapy, appreciate their help, they will assess him this week, Epley maneuver might help. Continue dimenhydrinate, add Zofran Prediabetes: Check A1c. RTC 4 weeks

## 2019-02-02 ENCOUNTER — Ambulatory Visit
Admission: RE | Admit: 2019-02-02 | Discharge: 2019-02-02 | Disposition: A | Discharge status: Home / Self Care | Payer: Federal, State, Local not specified - PPO | Source: Ambulatory Visit | Attending: Internal Medicine | Admitting: Internal Medicine

## 2019-02-02 ENCOUNTER — Other Ambulatory Visit: Payer: Self-pay

## 2019-02-02 DIAGNOSIS — R42 Dizziness and giddiness: Secondary | ICD-10-CM | POA: Diagnosis not present

## 2019-02-02 LAB — COMPREHENSIVE METABOLIC PANEL
ALT: 23 U/L (ref 0–53)
AST: 15 U/L (ref 0–37)
Albumin: 4.8 g/dL (ref 3.5–5.2)
Alkaline Phosphatase: 40 U/L (ref 39–117)
BILIRUBIN TOTAL: 0.6 mg/dL (ref 0.2–1.2)
BUN: 16 mg/dL (ref 6–23)
CO2: 29 mEq/L (ref 19–32)
Calcium: 10.1 mg/dL (ref 8.4–10.5)
Chloride: 102 mEq/L (ref 96–112)
Creatinine, Ser: 1.39 mg/dL (ref 0.40–1.50)
GFR: 68.1 mL/min (ref >60.00)
Glucose, Bld: 91 mg/dL (ref 70–99)
Potassium: 4.2 mEq/L (ref 3.5–5.1)
Sodium: 140 mEq/L (ref 135–145)
Total Protein: 7.4 g/dL (ref 6.0–8.3)

## 2019-02-02 LAB — CBC WITH DIFFERENTIAL/PLATELET
BASOS ABS: 0 10*3/uL (ref 0.0–0.1)
Basophils Relative: 0.7 % (ref 0.0–3.0)
Eosinophils Absolute: 0.1 10*3/uL (ref 0.0–0.7)
Eosinophils Relative: 3.1 % (ref 0.0–5.0)
HCT: 46 % (ref 39.0–52.0)
HEMOGLOBIN: 16 g/dL (ref 13.0–17.0)
Lymphocytes Relative: 34.4 % (ref 12.0–46.0)
Lymphs Abs: 1.4 10*3/uL (ref 0.7–4.0)
MCHC: 34.8 g/dL (ref 30.0–36.0)
MCV: 92.2 fl (ref 78.0–100.0)
Monocytes Absolute: 0.4 10*3/uL (ref 0.1–1.0)
Monocytes Relative: 8.9 % (ref 3.0–12.0)
Neutro Abs: 2.2 10*3/uL (ref 1.4–7.7)
Neutrophils Relative %: 52.9 % (ref 43.0–77.0)
Platelets: 236 10*3/uL (ref 150.0–400.0)
RBC: 4.99 Mil/uL (ref 4.22–5.81)
RDW: 13.7 % (ref 11.5–15.5)
WBC: 4.2 10*3/uL (ref 4.0–10.5)

## 2019-02-02 LAB — TSH: TSH: 1.64 u[IU]/mL (ref 0.35–4.50)

## 2019-02-02 LAB — HEMOGLOBIN A1C: Hgb A1c MFr Bld: 5.8 % (ref 4.6–6.5)

## 2019-02-02 MED ORDER — GADOBENATE DIMEGLUMINE 529 MG/ML IV SOLN
20.0000 mL | Freq: Once | INTRAVENOUS | Status: AC | PRN
  Administered 2019-02-02: 20 mL via INTRAVENOUS

## 2019-02-02 NOTE — Assessment & Plan Note (Signed)
Dizziness: As described above neurological exam remains normal. Although symptoms are likely peripheral, they are persistent and severe.  He has been unable to walk at times or drive. Plan: Checking a CMP, CBC, TSH MRI brain with and without.  Previously had difficulties with claustrophobia, he has some Xanax on hand. I spoke with physical therapy, appreciate their help, they will assess him this week, Epley maneuver might help. Continue dimenhydrinate, add Zofran Prediabetes: Check A1c. RTC 4 weeks

## 2019-02-06 DIAGNOSIS — H818X3 Other disorders of vestibular function, bilateral: Secondary | ICD-10-CM | POA: Diagnosis not present

## 2019-02-06 DIAGNOSIS — R42 Dizziness and giddiness: Secondary | ICD-10-CM | POA: Diagnosis not present

## 2019-02-10 ENCOUNTER — Telehealth: Payer: Self-pay

## 2019-02-10 NOTE — Telephone Encounter (Signed)
Plan of care signed and faxed to [facility] to [phone]. Form sent for scanning.

## 2019-02-15 DIAGNOSIS — H818X3 Other disorders of vestibular function, bilateral: Secondary | ICD-10-CM | POA: Diagnosis not present

## 2019-02-15 DIAGNOSIS — R42 Dizziness and giddiness: Secondary | ICD-10-CM | POA: Diagnosis not present

## 2019-02-28 ENCOUNTER — Encounter: Payer: Federal, State, Local not specified - PPO | Admitting: Internal Medicine

## 2019-03-17 ENCOUNTER — Encounter: Payer: Self-pay | Admitting: Internal Medicine

## 2019-03-17 ENCOUNTER — Other Ambulatory Visit: Payer: Self-pay

## 2019-03-17 ENCOUNTER — Ambulatory Visit (INDEPENDENT_AMBULATORY_CARE_PROVIDER_SITE_OTHER): Payer: Federal, State, Local not specified - PPO | Admitting: Internal Medicine

## 2019-03-17 DIAGNOSIS — Z0000 Encounter for general adult medical examination without abnormal findings: Secondary | ICD-10-CM

## 2019-03-17 NOTE — Assessment & Plan Note (Addendum)
--  Tdap 2016 --CCS: No family history of colon cancer --Prostate cancer screening: Grandfather had prostate cancer at a very advanced age. --Diet and exercise discussed -- Labs reviewed, due for FLP

## 2019-03-17 NOTE — Progress Notes (Signed)
Subjective:    Patient ID: Brandon Tyler, male    DOB: 1976-10-08, 43 y.o.   MRN: [id number]  DOS:  03/17/2019 Type of visit - description: Virtual Visit via Video Note  I connected with@ on 03/19/19 at  3:00 PM EDT by a video enabled telemedicine application and verified that I am speaking with the correct person using two identifiers.   THIS ENCOUNTER IS A VIRTUAL VISIT DUE TO COVID-19 - PATIENT WAS NOT SEEN IN THE OFFICE. PATIENT HAS CONSENTED TO VIRTUAL VISIT / TELEMEDICINE VISIT   Location of patient: home  Location of provider: office  I discussed the limitations of evaluation and management by telemedicine and the availability of in person appointments. The patient expressed understanding and agreed to proceed.  History of Present Illness: Complete physical exam  Since the last office visit, dizziness completely resolved. He did have a panic attack 03/05/2019. He woke up, his vision was slightly blurred, he started to get panicky, started shaking, he took 2 Xanax and called 911. By the time EMS arrived, he was feeling better. BP 140/100, heart rate 70, respiratory rate 18, O2 sat 97. They felt that he had panic attack.     BP Readings from Last 3 Encounters:  02/01/19 128/60  01/25/19 128/82  02/16/18 130/82     Review of Systems  Other than above, a 14 point review of systems is negative     Past Medical History:  Diagnosis Date  . Allergic rhinitis   . Arthritis   . H/O anxiety disorder   . Hyperlipidemia   . Palpitations 2007    saw cards, got a ECHO, told he was ok    Past Surgical History:  Procedure Laterality Date  . TONSILLECTOMY AND ADENOIDECTOMY      Social History   Socioeconomic History  . Marital status: Married    Spouse name: Not on file  . Number of children: 2  . Years of education: 10  . Highest education level: Not on file  Occupational History  . Occupation: unemployed   Social Needs  . Financial resource strain: Not on  file  . Food insecurity:    Worry: Not on file    Inability: Not on file  . Transportation needs:    Medical: Not on file    Non-medical: Not on file  Tobacco Use  . Smoking status: Never Smoker  . Smokeless tobacco: Never Used  Substance and Sexual Activity  . Alcohol use: Yes    Alcohol/week: 0.0 standard drinks    Comment: rarely   . Drug use: No    Comment: Previously used marijuana.  . Sexual activity: Not on file  Lifestyle  . Physical activity:    Days per week: Not on file    Minutes per session: Not on file  . Stress: Not on file  Relationships  . Social connections:    Talks on phone: Not on file    Gets together: Not on file    Attends religious service: Not on file    Active member of club or organization: Not on file    Attends meetings of clubs or organizations: Not on file    Relationship status: Not on file  . Intimate partner violence:    Fear of current or ex partner: Not on file    Emotionally abused: Not on file    Physically abused: Not on file    Forced sexual activity: Not on file  Other Topics Concern  .  Not on file  Social History Narrative   Lives w/ wife and 2 children  , 2009 and 2006   Right-handed.   Drinks 3 diet sodas per day.     Family History  Problem Relation Age of Onset  . Hypertension Mother   . Hyperlipidemia Mother   . Hypertension Father   . Hyperlipidemia Father   . Hyperlipidemia Maternal Grandmother   . CAD Maternal Grandmother   . Diabetes Other        distant relative   . CAD Other           . Prostate cancer Other        GF, passed in his late 34s  . Pancreatic cancer Other        GM, uncle  . Throat cancer Other        GF  . Colon cancer Neg Hx      Allergies as of 03/17/2019   No Known Allergies     Medication List       Accurate as of Mar 17, 2019 11:59 PM. Always use your most recent med list.        ALPRAZolam 0.25 MG tablet Commonly known as:  XANAX Take 1 tablet (0.25 mg total) by mouth  at bedtime as needed for anxiety.   atorvastatin 20 MG tablet Commonly known as:  LIPITOR Take 1 tablet (20 mg total) by mouth at bedtime.   dimenhyDRINATE 50 MG tablet Commonly known as:  DRAMAMINE Take 1 tablet (50 mg total) by mouth every 8 (eight) hours as needed.   ondansetron 4 MG tablet Commonly known as:  ZOFRAN Take 1 tablet (4 mg total) by mouth every 8 (eight) hours as needed for nausea or vomiting.   valACYclovir 1000 MG tablet Commonly known as:  Valtrex 2 tabs twice a day for one day for each episode of cold sores           Objective:   Physical Exam There were no vitals taken for this visit. This is a video virtual visit, alert oriented x3, no apparent distress, speech is fluent, he is walking around his house without any problems.    Assessment      Assessment: Prediabetes (a1c: 6.3   04/2017) Hyperlipidemia.  Anxiety  Seizure disorder? Saw neuro 12-2015, dx w/ SZ, Rx meds, EEG (-) 12-2015 Palpitations 2007, saw cards, got ECHO Fever blisters (daily valtrex)  PLAN: Here for CPX Dizziness: See last visit, MRI of the brain was normal, he saw physical therapy, he is back to normal.  No further evaluation needed Prediabetes: Recent A1c normal. High cholesterol: On Lipitor, checking labs, recent LFTs normal Anxiety: Recent episode of panic attack noted, recommend to continue Xanax as needed Elevated BP: BP when he was seen by EMS at home 140/100, he recheck several days later with arm cuff: 120/80, recommend to continue monitoring his BPs periodically.  BPs at the office typically wnl. RTC 1 year     I discussed the assessment and treatment plan with the patient. The patient was provided an opportunity to ask questions and all were answered. The patient agreed with the plan and demonstrated an understanding of the instructions.   The patient was advised to call back or seek an in-person evaluation if the symptoms worsen or if the condition fails to improve as  anticipated.

## 2019-03-19 NOTE — Assessment & Plan Note (Signed)
Here for CPX Dizziness: See last visit, MRI of the brain was normal, he saw physical therapy, he is back to normal.  No further evaluation needed Prediabetes: Recent A1c normal. High cholesterol: On Lipitor, checking labs, recent LFTs normal Anxiety: Recent episode of panic attack noted, recommend to continue Xanax as needed Elevated BP: BP when he was seen by EMS at home 140/100, he recheck several days later with arm cuff: 120/80, recommend to continue monitoring his BPs periodically.  BPs at the office typically wnl. RTC 1 year

## 2019-05-15 ENCOUNTER — Encounter: Payer: Self-pay | Admitting: Internal Medicine

## 2019-05-17 ENCOUNTER — Other Ambulatory Visit: Payer: Self-pay | Admitting: *Deleted

## 2019-05-17 DIAGNOSIS — Z20822 Contact with and (suspected) exposure to covid-19: Secondary | ICD-10-CM

## 2019-05-17 DIAGNOSIS — R5383 Other fatigue: Secondary | ICD-10-CM | POA: Diagnosis not present

## 2019-05-17 DIAGNOSIS — E291 Testicular hypofunction: Secondary | ICD-10-CM | POA: Diagnosis not present

## 2019-05-17 DIAGNOSIS — R6882 Decreased libido: Secondary | ICD-10-CM | POA: Diagnosis not present

## 2019-05-17 DIAGNOSIS — R4586 Emotional lability: Secondary | ICD-10-CM | POA: Diagnosis not present

## 2019-05-21 LAB — NOVEL CORONAVIRUS, NAA: SARS-CoV-2, NAA: NOT DETECTED

## 2019-05-22 DIAGNOSIS — R5383 Other fatigue: Secondary | ICD-10-CM | POA: Diagnosis not present

## 2019-05-22 DIAGNOSIS — R4586 Emotional lability: Secondary | ICD-10-CM | POA: Diagnosis not present

## 2019-05-22 DIAGNOSIS — E291 Testicular hypofunction: Secondary | ICD-10-CM | POA: Diagnosis not present

## 2019-05-22 DIAGNOSIS — R6882 Decreased libido: Secondary | ICD-10-CM | POA: Diagnosis not present

## 2019-05-24 ENCOUNTER — Ambulatory Visit (INDEPENDENT_AMBULATORY_CARE_PROVIDER_SITE_OTHER): Payer: Federal, State, Local not specified - PPO | Admitting: Internal Medicine

## 2019-05-24 ENCOUNTER — Other Ambulatory Visit: Payer: Self-pay

## 2019-05-24 DIAGNOSIS — J029 Acute pharyngitis, unspecified: Secondary | ICD-10-CM | POA: Diagnosis not present

## 2019-05-24 MED ORDER — PANTOPRAZOLE SODIUM 40 MG PO TBEC: 40.0000 mg | DELAYED_RELEASE_TABLET | Freq: Every day | ORAL | 0 refills | Status: DC

## 2019-05-24 NOTE — Progress Notes (Signed)
Subjective:    Patient ID: Brandon Tyler, male    DOB: [birth year], 43 y.o.   MRN: [id number]  DOS:  05/24/2019 Type of visit - description: Virtual Visit via Video Note  I connected with@ on 05/24/19 at  3:00 PM EDT by a video enabled telemedicine application and verified that I am speaking with the correct person using two identifiers.   THIS ENCOUNTER IS A VIRTUAL VISIT DUE TO COVID-19 - PATIENT WAS NOT SEEN IN THE OFFICE. PATIENT HAS CONSENTED TO VIRTUAL VISIT / TELEMEDICINE VISIT   Location of patient: home  Location of provider: office  I discussed the limitations of evaluation and management by telemedicine and the availability of in person appointments. The patient expressed understanding and agreed to proceed.  History of Present Illness: Acute 10 days ago developed sore throat described as a burning type of sensation of the throat.  It is on and off, has good days and bad days. At the same time he become extremely tired. He was tested for COVID-19 05/17/2019 and it was negative.  Review of Systems  Denies fever chills No unusual aches or pains No nasal discharge When asked, admits to heartburn lately.  He believes it is due to some weight gain. No cough or chest congestion.  Past Medical History:  Diagnosis Date  . Allergic rhinitis   . Arthritis   . H/O anxiety disorder   . Hyperlipidemia   . Palpitations 2007    saw cards, got a ECHO, told he was ok    Past Surgical History:  Procedure Laterality Date  . TONSILLECTOMY AND ADENOIDECTOMY      Social History   Socioeconomic History  . Marital status: Married    Spouse name: Not on file  . Number of children: 2  . Years of education: 49  . Highest education level: Not on file  Occupational History  . Occupation: unemployed   Social Needs  . Financial resource strain: Not on file  . Food insecurity    Worry: Not on file    Inability: Not on file  . Transportation needs    Medical: Not on file     Non-medical: Not on file  Tobacco Use  . Smoking status: Never Smoker  . Smokeless tobacco: Never Used  Substance and Sexual Activity  . Alcohol use: Yes    Alcohol/week: 0.0 standard drinks    Comment: rarely   . Drug use: No    Comment: Previously used marijuana.  . Sexual activity: Not on file  Lifestyle  . Physical activity    Days per week: Not on file    Minutes per session: Not on file  . Stress: Not on file  Relationships  . Social Herbalist on phone: Not on file    Gets together: Not on file    Attends religious service: Not on file    Active member of club or organization: Not on file    Attends meetings of clubs or organizations: Not on file    Relationship status: Not on file  . Intimate partner violence    Fear of current or ex partner: Not on file    Emotionally abused: Not on file    Physically abused: Not on file    Forced sexual activity: Not on file  Other Topics Concern  . Not on file  Social History Narrative   Lives w/ wife and 2 children  , 2009 and 2006   Right-handed.  Drinks 3 diet sodas per day.      Allergies as of 05/24/2019   No Known Allergies     Medication List       Accurate as of May 24, 2019  3:23 PM. If you have any questions, ask your nurse or doctor.        ALPRAZolam 0.25 MG tablet Commonly known as: XANAX Take 1 tablet (0.25 mg total) by mouth at bedtime as needed for anxiety.   atorvastatin 20 MG tablet Commonly known as: LIPITOR Take 1 tablet (20 mg total) by mouth at bedtime.   dimenhyDRINATE 50 MG tablet Commonly known as: DRAMAMINE Take 1 tablet (50 mg total) by mouth every 8 (eight) hours as needed.   ondansetron 4 MG tablet Commonly known as: ZOFRAN Take 1 tablet (4 mg total) by mouth every 8 (eight) hours as needed for nausea or vomiting.   valACYclovir 1000 MG tablet Commonly known as: Valtrex 2 tabs twice a day for one day for each episode of cold sores           Objective:    Physical Exam There were no vitals taken for this visit. This is a virtual video visit, he is alert oriented x3, no apparent distress.    Assessment     Assessment: Prediabetes (a1c: 6.3   04/2017) Hyperlipidemia.  Anxiety  Seizure disorder? Saw neuro 12-2015, dx w/ SZ, Rx meds, EEG (-) 12-2015 Palpitations 2007, saw cards, got ECHO Fever blisters (daily valtrex)  PLAN: Sore throat: As described above, no fever chills, COVID-19 testing negative.  DDX includes GERD and less likely strep infection. We agreed on pantoprazole for 3 weeks, to be taken empty daily before breakfast for 3 to 4 weeks. If within a week the sore throat is not better, he will send me a message and will prescribe antibiotics for possible strep infection. Still due for a FLP check, will call and schedule a visit.     I discussed the assessment and treatment plan with the patient. The patient was provided an opportunity to ask questions and all were answered. The patient agreed with the plan and demonstrated an understanding of the instructions.   The patient was advised to call back or seek an in-person evaluation if the symptoms worsen or if the condition fails to improve as anticipated.

## 2019-05-25 ENCOUNTER — Other Ambulatory Visit: Payer: Self-pay | Admitting: Internal Medicine

## 2019-05-26 NOTE — Assessment & Plan Note (Signed)
Sore throat: As described above, no fever chills, COVID-19 testing negative.  DDX includes GERD and less likely strep infection. We agreed on pantoprazole for 3 weeks, to be taken empty daily before breakfast for 3 to 4 weeks. If within a week the sore throat is not better, he will send me a message and will prescribe antibiotics for possible strep infection. Still due for a FLP check, will call and schedule a visit.

## 2019-05-29 DIAGNOSIS — E291 Testicular hypofunction: Secondary | ICD-10-CM | POA: Diagnosis not present

## 2019-05-30 ENCOUNTER — Other Ambulatory Visit: Payer: Self-pay

## 2019-05-30 ENCOUNTER — Other Ambulatory Visit (INDEPENDENT_AMBULATORY_CARE_PROVIDER_SITE_OTHER): Payer: Federal, State, Local not specified - PPO

## 2019-05-30 DIAGNOSIS — Z0000 Encounter for general adult medical examination without abnormal findings: Secondary | ICD-10-CM | POA: Diagnosis not present

## 2019-05-31 LAB — LIPID PANEL
Cholesterol: 162 mg/dL (ref 0–200)
HDL: 33.1 mg/dL — ABNORMAL LOW (ref >39.00)
LDL Cholesterol: 112 mg/dL — ABNORMAL HIGH (ref 0–99)
NonHDL: 128.48
Total CHOL/HDL Ratio: 5
Triglycerides: 82 mg/dL (ref 0.0–149.0)
VLDL: 16.4 mg/dL (ref 0.0–40.0)

## 2019-06-06 DIAGNOSIS — E291 Testicular hypofunction: Secondary | ICD-10-CM | POA: Diagnosis not present

## 2019-06-20 ENCOUNTER — Other Ambulatory Visit: Payer: Self-pay | Admitting: Internal Medicine

## 2019-06-21 DIAGNOSIS — E291 Testicular hypofunction: Secondary | ICD-10-CM | POA: Diagnosis not present

## 2019-06-21 DIAGNOSIS — R6882 Decreased libido: Secondary | ICD-10-CM | POA: Diagnosis not present

## 2019-06-21 DIAGNOSIS — R4586 Emotional lability: Secondary | ICD-10-CM | POA: Diagnosis not present

## 2019-06-21 DIAGNOSIS — R5383 Other fatigue: Secondary | ICD-10-CM | POA: Diagnosis not present

## 2019-06-23 DIAGNOSIS — E291 Testicular hypofunction: Secondary | ICD-10-CM | POA: Diagnosis not present

## 2019-06-23 DIAGNOSIS — E559 Vitamin D deficiency, unspecified: Secondary | ICD-10-CM | POA: Diagnosis not present

## 2019-06-23 DIAGNOSIS — N529 Male erectile dysfunction, unspecified: Secondary | ICD-10-CM | POA: Diagnosis not present

## 2019-06-29 ENCOUNTER — Other Ambulatory Visit: Payer: Self-pay | Admitting: Internal Medicine

## 2019-07-08 ENCOUNTER — Other Ambulatory Visit: Payer: Self-pay

## 2019-07-08 ENCOUNTER — Ambulatory Visit (HOSPITAL_COMMUNITY)
Admission: EM | Admit: 2019-07-08 | Discharge: 2019-07-08 | Disposition: A | Discharge status: Home / Self Care | Payer: Federal, State, Local not specified - PPO | Attending: Internal Medicine | Admitting: Internal Medicine

## 2019-07-08 ENCOUNTER — Encounter (HOSPITAL_COMMUNITY): Payer: Self-pay

## 2019-07-08 DIAGNOSIS — S139XXA Sprain of joints and ligaments of unspecified parts of neck, initial encounter: Secondary | ICD-10-CM | POA: Diagnosis not present

## 2019-07-08 MED ORDER — IBUPROFEN 400 MG PO TABS: 400.0000 mg | ORAL_TABLET | Freq: Four times a day (QID) | ORAL | 0 refills | Status: DC | PRN

## 2019-07-08 NOTE — ED Triage Notes (Signed)
Patient presents to Urgent Care with complaints of swelling and pain to back of his head on the left side since 4 days ago. Patient reports he has not had a insect bite that he can think of to cause this, his uncle died of a brain tumor recently and the pt is worried it may be something like that, swollen area on his head is approx 2 inches in diameter and it extends several more inches down his neck.

## 2019-07-08 NOTE — ED Provider Notes (Signed)
[place]    CSN: [id number] Arrival date & time: 07/08/19  1012      History   Chief Complaint Chief Complaint  Patient presents with  . Swelling on Head    HPI Brandon Tyler is a 43 y.o. male hyperlipidemia comes to urgent care with complaints of left-sided neck pain of several days duration.  Patient says symptoms started fairly rapidly and is been persistent.  Pain is localized to the left occipital region and extends into the left side of the neck.  Pain is worse with movement of the neck and is relieved by sitting still.  Patient denies any trauma to the neck.  He has not tried any over-the-counter medications.  No nausea or vomiting.  No confusion.  Patient is concerned about the symptoms because he lost a family relative several years ago to brain tumor. HPI  Past Medical History:  Diagnosis Date  . Allergic rhinitis   . Arthritis   . H/O anxiety disorder   . Hyperlipidemia   . Palpitations 2007    saw cards, got a ECHO, told he was ok    Patient Active Problem List   Diagnosis Date Noted  . History of cold sores 01/27/2019  . Prediabetes 02/17/2018  . Right knee pain 01/12/2017  . Hyperlipidemia 06/14/2016  . PCP NOTES >>>>>>>>>>>>>>>>>>> 04/05/2016  . Confusion 12/24/2015  . Left knee pain 12/05/2015  . Annual physical exam 01/26/2013  . ALLERGIC RHINITIS 02/10/2010  . Anxiety state 04/18/2009    Past Surgical History:  Procedure Laterality Date  . TONSILLECTOMY AND ADENOIDECTOMY         Home Medications    Prior to Admission medications   Medication Sig Start Date End Date Taking? Authorizing Provider  ALPRAZolam (XANAX) 0.25 MG tablet Take 1 tablet (0.25 mg total) by mouth at bedtime as needed for anxiety. 01/25/19   Colon Branch, MD  atorvastatin (LIPITOR) 20 MG tablet Take 1 tablet (20 mg total) by mouth at bedtime. 05/26/19   Colon Branch, MD  ibuprofen (ADVIL) 400 MG tablet Take 1 tablet (400 mg total) by mouth every 6 (six) hours  as needed. 07/08/19   , Myrene Galas, MD  valACYclovir (VALTREX) 1000 MG tablet TAKE 2 TABLETS A DAY FOR ONE DAY FOR EACH EPISODE OF COLD SORES. 06/30/19   Colon Branch, MD  pantoprazole (PROTONIX) 40 MG tablet Take 1 tablet (40 mg total) by mouth daily. 06/20/19 07/08/19  Colon Branch, MD    Family History Family History  Problem Relation Age of Onset  . Hypertension Mother   . Hyperlipidemia Mother   . Hypertension Father   . Hyperlipidemia Father   . Hyperlipidemia Maternal Grandmother   . CAD Maternal Grandmother   . Diabetes Other        distant relative   . CAD Other           . Prostate cancer Other        GF, passed in his late 66s  . Pancreatic cancer Other        GM, uncle  . Throat cancer Other        GF  . Colon cancer Neg Hx     Social History Social History   Tobacco Use  . Smoking status: Never Smoker  . Smokeless tobacco: Never Used  Substance Use Topics  . Alcohol use: Yes    Alcohol/week: 7.0 standard drinks    Types: 7 Cans of beer per  week  . Drug use: No    Comment: Previously used marijuana.     Allergies   Patient has no known allergies.   Review of Systems Review of Systems  Constitutional: Negative.   Respiratory: Negative.   Gastrointestinal: Negative.   Genitourinary: Negative.   Musculoskeletal: Negative.   Neurological: Positive for headaches. Negative for dizziness, tremors, syncope, weakness and numbness.  Psychiatric/Behavioral: Negative.      Physical Exam Triage Vital Signs ED Triage Vitals  Enc Vitals Group     BP 07/08/19 1023 128/85     Pulse Rate 07/08/19 1023 60     Resp 07/08/19 1023 18     Temp 07/08/19 1023 98.1 F (36.7 C)     Temp Source 07/08/19 1023 Tympanic     SpO2 07/08/19 1023 97 %     Weight --      Height --      Head Circumference --      Peak Flow --      Pain Score 07/08/19 1034 0     Pain Loc --      Pain Edu? --      Excl. in [place]? --    No data found.  Updated Vital Signs BP 128/85 (BP  Location: Left Arm)   Pulse 60   Temp 98.1 F (36.7 C) (Tympanic)   Resp 18   SpO2 97%   Visual Acuity Right Eye Distance:   Left Eye Distance:   Bilateral Distance:    Right Eye Near:   Left Eye Near:    Bilateral Near:     Physical Exam Constitutional:      Appearance: He is not ill-appearing or toxic-appearing.  Abdominal:     General: Bowel sounds are normal.     Palpations: Abdomen is soft.  Musculoskeletal: Normal range of motion.     Comments: Tenderness on palpation of the trapezius muscles on the left side of the neck.  No masses noted on the scalp.  Full range of motion around the neck.  Skin:    General: Skin is warm.     Capillary Refill: Capillary refill takes less than 2 seconds.     Coloration: Skin is not jaundiced.     Findings: No bruising or lesion.  Neurological:     General: No focal deficit present.     Mental Status: He is alert and oriented to person, place, and time.     Cranial Nerves: No cranial nerve deficit.     Motor: No weakness.     Coordination: Coordination normal.     Gait: Gait normal.     Deep Tendon Reflexes: Reflexes normal.  Psychiatric:        Mood and Affect: Mood normal.        Behavior: Behavior normal.      UC Treatments / Results  Labs (all labs ordered are listed, but only abnormal results are displayed) Labs Reviewed - No data to display  EKG   Radiology No results found.  Procedures Procedures (including critical care time)  Medications Ordered in UC Medications - No data to display  Initial Impression / Assessment and Plan / UC Course  I have reviewed the triage vital signs and the nursing notes.  Pertinent labs & imaging results that were available during my care of the patient were reviewed by me and considered in my medical decision making (see chart for details).   1.  Musculoskeletal neck sprain: Ibuprofen every 4 hours as  needed for pain Gentle range of motion exercises Warm compress over the  neck for about 5 minutes with 15 to 20-minute intervals over 1 hours. If patient's symptoms worsens he is advised to return to urgent care.  If he notices any numbness or weakness in the upper extremities he is advised to go to emergency department to be reevaluated.  Final Clinical Impressions(s) / UC Diagnoses   Final diagnoses:  Neck sprain, initial encounter   Discharge Instructions   None    ED Prescriptions    Medication Sig Dispense Auth. Provider   ibuprofen (ADVIL) 400 MG tablet Take 1 tablet (400 mg total) by mouth every 6 (six) hours as needed. 30 tablet , Myrene Galas, MD     Controlled Substance Prescriptions [place] Controlled Substance Registry consulted? No   Chase Picket, MD 07/08/19 [phone]

## 2019-07-11 DIAGNOSIS — L738 Other specified follicular disorders: Secondary | ICD-10-CM | POA: Diagnosis not present

## 2019-09-27 DIAGNOSIS — R6882 Decreased libido: Secondary | ICD-10-CM | POA: Diagnosis not present

## 2019-09-27 DIAGNOSIS — E291 Testicular hypofunction: Secondary | ICD-10-CM | POA: Diagnosis not present

## 2019-09-27 DIAGNOSIS — R5383 Other fatigue: Secondary | ICD-10-CM | POA: Diagnosis not present

## 2019-09-27 DIAGNOSIS — R4586 Emotional lability: Secondary | ICD-10-CM | POA: Diagnosis not present

## 2019-09-29 DIAGNOSIS — R5383 Other fatigue: Secondary | ICD-10-CM | POA: Diagnosis not present

## 2019-09-29 DIAGNOSIS — E291 Testicular hypofunction: Secondary | ICD-10-CM | POA: Diagnosis not present

## 2019-09-29 DIAGNOSIS — R6882 Decreased libido: Secondary | ICD-10-CM | POA: Diagnosis not present

## 2019-10-26 ENCOUNTER — Encounter: Payer: Self-pay | Admitting: Internal Medicine

## 2019-10-26 MED ORDER — ALPRAZOLAM 0.25 MG PO TABS: 0.2500 mg | ORAL_TABLET | Freq: Every evening | ORAL | 3 refills | Status: DC | PRN

## 2019-10-26 MED ORDER — ATORVASTATIN CALCIUM 20 MG PO TABS: 20.0000 mg | ORAL_TABLET | Freq: Every day | ORAL | 2 refills | Status: DC

## 2019-10-26 MED ORDER — FLUTICASONE PROPIONATE 50 MCG/ACT NA SUSP: 2.0000 | Freq: Every day | NASAL | 5 refills | Status: DC | PRN

## 2019-10-26 NOTE — Telephone Encounter (Signed)
Alprazolam refill.  Last OV: 05/24/2019 Last Fill: 01/25/2019 #30 and 1RF Pt sig: 1 tab qhs prn UDS: 10/18/2017 Low risk   Needs UDS at next OV

## 2019-10-26 NOTE — Telephone Encounter (Signed)
Sent!

## 2019-11-29 ENCOUNTER — Ambulatory Visit: Payer: Federal, State, Local not specified - PPO | Attending: Internal Medicine

## 2019-11-29 DIAGNOSIS — Z20822 Contact with and (suspected) exposure to covid-19: Secondary | ICD-10-CM | POA: Diagnosis not present

## 2019-11-30 LAB — NOVEL CORONAVIRUS, NAA: SARS-CoV-2, NAA: NOT DETECTED

## 2020-01-26 ENCOUNTER — Ambulatory Visit: Payer: Self-pay | Attending: Internal Medicine

## 2020-01-26 DIAGNOSIS — Z23 Encounter for immunization: Secondary | ICD-10-CM

## 2020-01-26 NOTE — Progress Notes (Signed)
   Covid-19 Vaccination Clinic  Name:  KEYSHUN COHN    MRN: [id number] DOB: 17-Mar-1976  01/26/2020  Mr. Vonstein was observed post Covid-19 immunization for 15 minutes without incident. He was provided with Vaccine Information Sheet and instruction to access the V-Safe system.   Mr. Cossette was instructed to call 911 with any severe reactions post vaccine: Marland Kitchen Difficulty breathing  . Swelling of face and throat  . A fast heartbeat  . A bad rash all over body  . Dizziness and weakness   Immunizations Administered    Name Date Dose VIS Date Route   Pfizer COVID-19 Vaccine 01/26/2020  3:49 PM 0.3 mL 10/27/2019 Intramuscular   Manufacturer: [place]   Lot: [id number]   [place]: [id number]

## 2020-02-19 ENCOUNTER — Ambulatory Visit: Payer: Federal, State, Local not specified - PPO | Attending: Internal Medicine

## 2020-02-19 DIAGNOSIS — Z23 Encounter for immunization: Secondary | ICD-10-CM

## 2020-02-19 NOTE — Progress Notes (Signed)
   Covid-19 Vaccination Clinic  Name:  Brandon Tyler    MRN: [id number] DOB: Aug 06, 1976  02/19/2020  [name] was observed post Covid-19 immunization for 15 minutes without incident. He was provided with Vaccine Information Sheet and instruction to access the V-Safe system.   [name] was instructed to call 911 with any severe reactions post vaccine: Marland Kitchen Difficulty breathing  . Swelling of face and throat  . A fast heartbeat  . A bad rash all over body  . Dizziness and weakness   Immunizations Administered    Name Date Dose VIS Date Route   Pfizer COVID-19 Vaccine 02/19/2020  5:24 PM 0.3 mL 10/27/2019 Intramuscular   Manufacturer: [place]   Lot: [id number]   [place]: [id number]

## 2020-07-17 ENCOUNTER — Encounter: Payer: Self-pay | Admitting: Internal Medicine

## 2020-07-17 MED ORDER — AZELASTINE HCL 0.1 % NA SOLN: 2.0000 | Freq: Two times a day (BID) | NASAL | 6 refills | Status: AC

## 2020-07-31 ENCOUNTER — Ambulatory Visit: Payer: Federal, State, Local not specified - PPO | Admitting: Internal Medicine

## 2020-07-31 ENCOUNTER — Other Ambulatory Visit: Payer: Self-pay

## 2020-07-31 ENCOUNTER — Encounter: Payer: Self-pay | Admitting: Internal Medicine

## 2020-07-31 VITALS — BP 132/91 | HR 56 | Temp 97.8°F | Resp 18 | Ht 71.0 in | Wt 251.1 lb

## 2020-07-31 DIAGNOSIS — M545 Low back pain, unspecified: Secondary | ICD-10-CM

## 2020-07-31 DIAGNOSIS — F411 Generalized anxiety disorder: Secondary | ICD-10-CM

## 2020-07-31 MED ORDER — CYCLOBENZAPRINE HCL 10 MG PO TABS: 10.0000 mg | ORAL_TABLET | Freq: Every evening | ORAL | 0 refills | Status: DC | PRN

## 2020-07-31 MED ORDER — ALPRAZOLAM 0.25 MG PO TABS: 0.2500 mg | ORAL_TABLET | Freq: Every evening | ORAL | 0 refills | Status: DC | PRN

## 2020-07-31 NOTE — Progress Notes (Signed)
Pre visit review using our clinic review tool, if applicable. No additional management support is needed unless otherwise documented below in the visit note. 

## 2020-07-31 NOTE — Progress Notes (Signed)
Subjective:    Patient ID: Brandon Tyler, male    DOB: 04-27-1976, 44 y.o.   MRN: [id number]  DOS:  07/31/2020 Type of visit - description: Acute Has mild back pain on and off for a while. A week ago he played golf and the pain increased. Is located in the low back, bilaterally. Few times it has radiated to the left buttock and thigh. He denies any numbness at the lower extremities. No injury or heavy lifting however at his job he walks constantly.   BP Readings from Last 3 Encounters:  07/31/20 (!) 132/91  07/08/19 128/85  02/01/19 128/60     Review of Systems See above   Past Medical History:  Diagnosis Date  . Allergic rhinitis   . Arthritis   . H/O anxiety disorder   . Hyperlipidemia   . Palpitations 2007    saw cards, got a ECHO, told he was ok    Past Surgical History:  Procedure Laterality Date  . TONSILLECTOMY AND ADENOIDECTOMY      Allergies as of 07/31/2020   No Known Allergies     Medication List       Accurate as of July 31, 2020 11:28 AM. If you have any questions, ask your nurse or doctor.        ALPRAZolam 0.25 MG tablet Commonly known as: XANAX Take 1 tablet (0.25 mg total) by mouth at bedtime as needed for anxiety.   atorvastatin 20 MG tablet Commonly known as: LIPITOR Take 1 tablet (20 mg total) by mouth at bedtime.   azelastine 0.1 % nasal spray Commonly known as: ASTELIN Place 2 sprays into both nostrils 2 (two) times daily.   fluticasone 50 MCG/ACT nasal spray Commonly known as: FLONASE Place 2 sprays into both nostrils daily as needed for allergies or rhinitis.   ibuprofen 400 MG tablet Commonly known as: ADVIL Take 1 tablet (400 mg total) by mouth every 6 (six) hours as needed.   valACYclovir 1000 MG tablet Commonly known as: VALTREX TAKE 2 TABLETS A DAY FOR ONE DAY FOR EACH EPISODE OF COLD SORES.          Objective:   Physical Exam BP (!) 132/91 (BP Location: Right Arm, Patient Position: Sitting, Cuff Size:  Normal)   Pulse (!) 56   Temp 97.8 F (36.6 C) (Oral)   Resp 18   Ht 5\' 11"  (1.803 m)   Wt 251 lb 2 oz (113.9 kg)   SpO2 98%   BMI 35.02 kg/m  General:   Well developed, NAD, BMI noted. HEENT:  Normocephalic . Face symmetric, atraumatic MSK: No TTP at the lumbosacral spine or SIs Lower extremities: no pretibial edema bilaterally  Skin: Not pale. Not jaundice Neurologic:  alert & oriented X3.  Speech normal, gait appropriate for age and unassisted. Motor symmetric.  DTRs: Symmetrically decreased knee jerks, normal ankle jerks Straight leg test negative Psych--  Cognition and judgment appear intact.  Cooperative with normal attention span and concentration.  Behavior appropriate. No anxious or depressed appearing.      Assessment    Assessment: Prediabetes (a1c: 6.3   04/2017) Hyperlipidemia.  Anxiety  Seizure disorder? Saw neuro 12-2015, dx w/ SZ, Rx meds, EEG (-) 12-2015 Palpitations 2007, saw cards, got ECHO Fever blisters (daily valtrex)  PLAN: Lumbalgia: With no red flag symptoms in the context of mild chronic back pain. Acute treatment with Tylenol, ibuprofen (GI precautions discussed), heating pad, Flexeril, watch for drowsiness. If not better let me know.  Long-term he would benefit from physical therapy, states he has a physical therapy friend plans to reach out to him.  Recommend to work on good posture and back stretching. Anxiety: RF Xanax RTC as scheduled for November, CPX  This visit occurred during the SARS-CoV-2 public health emergency.  Safety protocols were in place, including screening questions prior to the visit, additional usage of staff PPE, and extensive cleaning of exam room while observing appropriate contact time as indicated for disinfecting solutions.

## 2020-07-31 NOTE — Patient Instructions (Addendum)
Alternate over-the-counter Tylenol and ibuprofen  Tylenol  500 mg OTC 2 tabs a day every 8 hours as needed for pain  IBUPROFEN (Advil or Motrin) 200 mg 2 tablets every 8 hours as needed for pain.  Always take it with food because may cause gastritis and ulcers.  If you notice nausea, stomach pain, change in the color of stools --->  Stop the medicine and let us know   Flexeril at night  Warm compresses or a heating pad   See a physical therapist to learn good posture and back stretching  Call if not gradually better

## 2020-08-01 NOTE — Assessment & Plan Note (Signed)
Lumbalgia: With no red flag symptoms in the context of mild chronic back pain. Acute treatment with Tylenol, ibuprofen (GI precautions discussed), heating pad, Flexeril, watch for drowsiness. If not better let me know. Long-term he would benefit from physical therapy, states he has a physical therapy friend plans to reach out to him.  Recommend to work on good posture and back stretching. Anxiety: RF Xanax RTC as scheduled for November, CPX

## 2020-08-23 ENCOUNTER — Encounter: Payer: Self-pay | Admitting: Internal Medicine

## 2020-10-02 ENCOUNTER — Encounter: Payer: Self-pay | Admitting: Internal Medicine

## 2020-10-02 ENCOUNTER — Other Ambulatory Visit: Payer: Self-pay

## 2020-10-02 ENCOUNTER — Ambulatory Visit (INDEPENDENT_AMBULATORY_CARE_PROVIDER_SITE_OTHER): Payer: Federal, State, Local not specified - PPO | Admitting: Internal Medicine

## 2020-10-02 VITALS — BP 127/83 | HR 54 | Temp 98.1°F | Resp 16 | Ht 71.0 in | Wt 257.4 lb

## 2020-10-02 DIAGNOSIS — Z1159 Encounter for screening for other viral diseases: Secondary | ICD-10-CM

## 2020-10-02 DIAGNOSIS — L219 Seborrheic dermatitis, unspecified: Secondary | ICD-10-CM

## 2020-10-02 DIAGNOSIS — Z0000 Encounter for general adult medical examination without abnormal findings: Secondary | ICD-10-CM

## 2020-10-02 DIAGNOSIS — Z23 Encounter for immunization: Secondary | ICD-10-CM | POA: Diagnosis not present

## 2020-10-02 DIAGNOSIS — E785 Hyperlipidemia, unspecified: Secondary | ICD-10-CM | POA: Diagnosis not present

## 2020-10-02 DIAGNOSIS — Z79899 Other long term (current) drug therapy: Secondary | ICD-10-CM | POA: Diagnosis not present

## 2020-10-02 DIAGNOSIS — R739 Hyperglycemia, unspecified: Secondary | ICD-10-CM | POA: Diagnosis not present

## 2020-10-02 DIAGNOSIS — L989 Disorder of the skin and subcutaneous tissue, unspecified: Secondary | ICD-10-CM

## 2020-10-02 DIAGNOSIS — K130 Diseases of lips: Secondary | ICD-10-CM

## 2020-10-02 DIAGNOSIS — F411 Generalized anxiety disorder: Secondary | ICD-10-CM

## 2020-10-02 NOTE — Patient Instructions (Signed)
Start doing physical therapy for your back  Okay to take Aleve as needed for pain. Always take it with food because may cause gastritis and ulcers.  If you notice nausea, stomach pain, change in the color of stools --->  Stop the medicine and let us know  We are referring you to the dermatologist   GO TO THE LAB : Get the blood work     [place], Ali Molina back for a checkup in 6 months

## 2020-10-02 NOTE — Progress Notes (Signed)
Subjective:    Patient ID: Brandon Tyler, male    DOB: Mar 27, 1976, 44 y.o.   MRN: [id number]  DOS:  10/02/2020 Type of visit - description: CPX  In addition to CPX, other issues discussed today.  Lumbalgia: See last visit, not improving, is taking Aleve daily with some relief, does not like to take Flexeril "too much medicine".  Pain does not radiate to the legs, no paresthesias, no bladder or bowel incontinence.  Also has a lesion at the lower lip, has getting darker gradually.  No bleeding.  Also has severe dandruff.  Review of Systems  Other than above, a 14 point review of systems is negative      Past Medical History:  Diagnosis Date  . Allergic rhinitis   . Arthritis   . H/O anxiety disorder   . Hyperlipidemia   . Palpitations 2007    saw cards, got a ECHO, told he was ok    Past Surgical History:  Procedure Laterality Date  . TONSILLECTOMY AND ADENOIDECTOMY      Allergies as of 10/02/2020   No Known Allergies     Medication List       Accurate as of October 02, 2020 11:59 PM. If you have any questions, ask your nurse or doctor.        STOP taking these medications   pantoprazole 40 MG tablet Commonly known as: PROTONIX Stopped by: Kathlene November, MD     TAKE these medications   ALPRAZolam 0.25 MG tablet Commonly known as: XANAX Take 1 tablet (0.25 mg total) by mouth at bedtime as needed for anxiety.   atorvastatin 20 MG tablet Commonly known as: LIPITOR Take 1 tablet (20 mg total) by mouth at bedtime.   azelastine 0.1 % nasal spray Commonly known as: ASTELIN Place 2 sprays into both nostrils 2 (two) times daily.   cyclobenzaprine 10 MG tablet Commonly known as: FLEXERIL Take 1 tablet (10 mg total) by mouth at bedtime as needed for muscle spasms.   fluticasone 50 MCG/ACT nasal spray Commonly known as: FLONASE Place 2 sprays into both nostrils daily as needed for allergies or rhinitis.   ibuprofen 400 MG tablet Commonly known as:  ADVIL Take 1 tablet (400 mg total) by mouth every 6 (six) hours as needed.   valACYclovir 1000 MG tablet Commonly known as: VALTREX TAKE 2 TABLETS A DAY FOR ONE DAY FOR EACH EPISODE OF COLD SORES.          Objective:   Physical Exam BP 127/83 (BP Location: Left Arm, Patient Position: Sitting, Cuff Size: Normal)   Pulse (!) 54   Temp 98.1 F (36.7 C) (Oral)   Resp 16   Ht 5\' 11"  (1.803 m)   Wt 257 lb 6 oz (116.7 kg)   SpO2 96%   BMI 35.90 kg/m  General: Well developed, NAD, BMI noted Neck: No  thyromegaly  HEENT:  Normocephalic . Face symmetric, atraumatic. Lower lip: He has 2 dark skin lesions, the most noticeable is the one on the left, about 2 x 1 mm . Lungs:  CTA B Normal respiratory effort, no intercostal retractions, no accessory muscle use. Heart: RRR,  no murmur.  Abdomen:  Not distended, soft, non-tender. No rebound or rigidity.   Lower extremities: no pretibial edema bilaterally  Skin: Scalp with severe dryness and dandruff.  Some erythema noted as well. Neurologic:  alert & oriented X3.  Speech normal, gait appropriate for age and unassisted Strength symmetric and appropriate for age.  Reflexes: Symmetrically decreased particularly at the knees. Psych: Cognition and judgment appear intact.  Cooperative with normal attention span and concentration.  Behavior appropriate. No anxious or depressed appearing.     Assessment      Assessment: Prediabetes (a1c: 6.3   04/2017) Hyperlipidemia.  Anxiety  Seizure disorder? Saw neuro 12-2015, dx w/ SZ, Rx meds, EEG (-) 12-2015 Palpitations 2007, saw cards, got ECHO Fever blisters (daily valtrex)  PLAN: Here for CPX Prediabetes: Check A1c Hyperlipidemia: On Lipitor, checking labs Anxiety: On Xanax, uses mostly to help her sleep at night, has not needed in the last few weeks, UDS and contract today. Lip lesions: Dark lower lip lesions x2, more noticeable on the left, getting larger per patient, refer to  dermatology Scalp dermatitis: He has very dry scalp and some erythema, I wonder if he has another condition in addition to dandruff. Refer to dermatology. Lumbalgia: Still symptomatic, we talked about options including x-ray, formal PT, ortho.  Declined, again he states that his PT friend will help him.  He is taking Aleve daily with some relief, d/w pt  GI precautions.  Will let me know if not improving. RTC 6 months  In addition to CPX we address chronic medical issues, 2 new problems (lip lesions, scalp dermatitis) and are not improving problem (lumbalgia). This visit occurred during the SARS-CoV-2 public health emergency.  Safety protocols were in place, including screening questions prior to the visit, additional usage of staff PPE, and extensive cleaning of exam room while observing appropriate contact time as indicated for disinfecting solutions.

## 2020-10-02 NOTE — Progress Notes (Signed)
Pre visit review using our clinic review tool, if applicable. No additional management support is needed unless otherwise documented below in the visit note. 

## 2020-10-03 ENCOUNTER — Encounter: Payer: Self-pay | Admitting: Internal Medicine

## 2020-10-03 LAB — CBC WITH DIFFERENTIAL/PLATELET
Basophils Absolute: 0 10*3/uL (ref 0.0–0.1)
Basophils Relative: 0.7 % (ref 0.0–3.0)
Eosinophils Absolute: 0.2 10*3/uL (ref 0.0–0.7)
Eosinophils Relative: 4.8 % (ref 0.0–5.0)
HCT: 44.8 % (ref 39.0–52.0)
Hemoglobin: 15.6 g/dL (ref 13.0–17.0)
Lymphocytes Relative: 40.3 % (ref 12.0–46.0)
Lymphs Abs: 1.4 10*3/uL (ref 0.7–4.0)
MCHC: 34.8 g/dL (ref 30.0–36.0)
MCV: 91.2 fl (ref 78.0–100.0)
Monocytes Absolute: 0.4 10*3/uL (ref 0.1–1.0)
Monocytes Relative: 10.9 % (ref 3.0–12.0)
Neutro Abs: 1.5 10*3/uL (ref 1.4–7.7)
Neutrophils Relative %: 43.3 % (ref 43.0–77.0)
Platelets: 193 10*3/uL (ref 150.0–400.0)
RBC: 4.91 Mil/uL (ref 4.22–5.81)
RDW: 13.5 % (ref 11.5–15.5)
WBC: 3.5 10*3/uL — ABNORMAL LOW (ref 4.0–10.5)

## 2020-10-03 LAB — DRUG MONITORING, PANEL 8 WITH CONFIRMATION, URINE
6 Acetylmorphine: NEGATIVE ng/mL (ref <10)
Alcohol Metabolites: NEGATIVE ng/mL
Amphetamines: NEGATIVE ng/mL (ref <500)
Benzodiazepines: NEGATIVE ng/mL (ref <100)
Buprenorphine, Urine: NEGATIVE ng/mL (ref <5)
Cocaine Metabolite: NEGATIVE ng/mL (ref <150)
Creatinine: 180.4 mg/dL
MDMA: NEGATIVE ng/mL (ref <500)
Marijuana Metabolite: NEGATIVE ng/mL (ref <20)
Opiates: NEGATIVE ng/mL (ref <100)
Oxidant: NEGATIVE ug/mL
Oxycodone: NEGATIVE ng/mL (ref <100)
pH: 5.2 (ref 4.5–9.0)

## 2020-10-03 LAB — COMPREHENSIVE METABOLIC PANEL
ALT: 31 U/L (ref 0–53)
AST: 22 U/L (ref 0–37)
Albumin: 4.5 g/dL (ref 3.5–5.2)
Alkaline Phosphatase: 36 U/L — ABNORMAL LOW (ref 39–117)
BUN: 18 mg/dL (ref 6–23)
CO2: 28 mEq/L (ref 19–32)
Calcium: 9.5 mg/dL (ref 8.4–10.5)
Chloride: 104 mEq/L (ref 96–112)
Creatinine, Ser: 1.21 mg/dL (ref 0.40–1.50)
GFR: 73.71 mL/min (ref >60.00)
Glucose, Bld: 90 mg/dL (ref 70–99)
Potassium: 4.2 mEq/L (ref 3.5–5.1)
Sodium: 139 mEq/L (ref 135–145)
Total Bilirubin: 0.6 mg/dL (ref 0.2–1.2)
Total Protein: 7.1 g/dL (ref 6.0–8.3)

## 2020-10-03 LAB — HEMOGLOBIN A1C: Hgb A1c MFr Bld: 5.9 % (ref 4.6–6.5)

## 2020-10-03 LAB — LIPID PANEL
Cholesterol: 262 mg/dL — ABNORMAL HIGH (ref 0–200)
HDL: 43.3 mg/dL (ref >39.00)
LDL Cholesterol: 194 mg/dL — ABNORMAL HIGH (ref 0–99)
NonHDL: 219.16
Total CHOL/HDL Ratio: 6
Triglycerides: 125 mg/dL (ref 0.0–149.0)
VLDL: 25 mg/dL (ref 0.0–40.0)

## 2020-10-03 LAB — HEPATITIS C ANTIBODY
Hepatitis C Ab: NONREACTIVE
SIGNAL TO CUT-OFF: 0.01 (ref <1.00)

## 2020-10-03 LAB — DM TEMPLATE

## 2020-10-03 NOTE — Assessment & Plan Note (Signed)
-  Tdap 2016 - Covid vax x 2  - flu shot today  --CCS: No family history of colon cancer --Prostate cancer screening: Grandfather had prostate cancer at a very advanced age. --Diet and exercise discussed -- Labs: CMP, FLP, CBC, A1c, hep C

## 2020-10-03 NOTE — Assessment & Plan Note (Signed)
Here for CPX Prediabetes: Check A1c Hyperlipidemia: On Lipitor, checking labs Anxiety: On Xanax, uses mostly to help her sleep at night, has not needed in the last few weeks, UDS and contract today. Lip lesions: Dark lower lip lesions x2, more noticeable on the left, getting larger per patient, refer to dermatology Scalp dermatitis: He has very dry scalp and some erythema, I wonder if he has another condition in addition to dandruff. Refer to dermatology. Lumbalgia: Still symptomatic, we talked about options including x-ray, formal PT, ortho.  Declined, again he states that his PT friend will help him.  He is taking Aleve daily with some relief, d/w pt  GI precautions.  Will let me know if not improving. RTC 6 months

## 2020-10-07 NOTE — Addendum Note (Signed)
Addended byDamita Dunnings D on: 10/07/2020 08:14 AM   Modules accepted: Orders

## 2020-11-12 ENCOUNTER — Other Ambulatory Visit: Payer: Self-pay | Admitting: Internal Medicine

## 2020-11-28 ENCOUNTER — Other Ambulatory Visit: Payer: Federal, State, Local not specified - PPO

## 2020-11-28 DIAGNOSIS — Z20822 Contact with and (suspected) exposure to covid-19: Secondary | ICD-10-CM | POA: Diagnosis not present

## 2020-11-30 LAB — SARS-COV-2, NAA 2 DAY TAT

## 2020-11-30 LAB — NOVEL CORONAVIRUS, NAA: SARS-CoV-2, NAA: NOT DETECTED

## 2020-12-06 ENCOUNTER — Other Ambulatory Visit: Payer: Self-pay | Admitting: Internal Medicine

## 2020-12-12 DIAGNOSIS — L03019 Cellulitis of unspecified finger: Secondary | ICD-10-CM | POA: Diagnosis not present

## 2020-12-16 DIAGNOSIS — B353 Tinea pedis: Secondary | ICD-10-CM | POA: Diagnosis not present

## 2020-12-16 DIAGNOSIS — L814 Other melanin hyperpigmentation: Secondary | ICD-10-CM | POA: Diagnosis not present

## 2020-12-16 DIAGNOSIS — L218 Other seborrheic dermatitis: Secondary | ICD-10-CM | POA: Diagnosis not present

## 2020-12-16 DIAGNOSIS — L918 Other hypertrophic disorders of the skin: Secondary | ICD-10-CM | POA: Diagnosis not present

## 2021-04-02 ENCOUNTER — Ambulatory Visit: Payer: Federal, State, Local not specified - PPO | Admitting: Internal Medicine

## 2021-04-03 ENCOUNTER — Emergency Department
Admission: EM | Admit: 2021-04-03 | Discharge: 2021-04-03 | Disposition: A | Discharge status: Home / Self Care | Payer: Federal, State, Local not specified - PPO | Source: Home / Self Care

## 2021-04-03 ENCOUNTER — Other Ambulatory Visit: Payer: Self-pay

## 2021-04-03 ENCOUNTER — Encounter: Payer: Self-pay | Admitting: Emergency Medicine

## 2021-04-03 DIAGNOSIS — R42 Dizziness and giddiness: Secondary | ICD-10-CM | POA: Diagnosis not present

## 2021-04-03 MED ORDER — MECLIZINE HCL 25 MG PO TABS: 25.0000 mg | ORAL_TABLET | Freq: Three times a day (TID) | ORAL | 0 refills | Status: DC | PRN

## 2021-04-03 NOTE — Discharge Instructions (Signed)
Advised/instructed patient to take medication as directed daily, as needed.  Advised patient if symptoms worsen and/or unresolved please follow-up with PCP or here for further evaluation.  Work note provided per patient request.

## 2021-04-03 NOTE — ED Provider Notes (Signed)
[name]    CSN: [id number] Arrival date & time: 04/03/21  0934      History   Chief Complaint Chief Complaint  Patient presents with  . Dizziness    HPI Brandon Tyler is a 45 y.o. male.   HPI 45 year old male presents with episode of vertigo yesterday.  Reports similar episode roughly 2 years ago.  Denies headache, changes in visual acuity, presyncopal or syncopal episodes.  Past Medical History:  Diagnosis Date  . Allergic rhinitis   . Arthritis   . H/O anxiety disorder   . Hyperlipidemia   . Palpitations 2007    saw cards, got a ECHO, told he was ok    Patient Active Problem List   Diagnosis Date Noted  . History of cold sores 01/27/2019  . Prediabetes 02/17/2018  . Right knee pain 01/12/2017  . Hyperlipidemia 06/14/2016  . PCP NOTES >>>>>>>>>>>>>>>>>>> 04/05/2016  . Confusion 12/24/2015  . Left knee pain 12/05/2015  . Annual physical exam 01/26/2013  . ALLERGIC RHINITIS 02/10/2010  . Anxiety state 04/18/2009    Past Surgical History:  Procedure Laterality Date  . TONSILLECTOMY AND ADENOIDECTOMY         Home Medications    Prior to Admission medications   Medication Sig Start Date End Date Taking? Authorizing Provider  ALPRAZolam (XANAX) 0.25 MG tablet Take 1 tablet (0.25 mg total) by mouth at bedtime as needed for anxiety. 07/31/20  Yes Paz, Alda Berthold, MD  atorvastatin (LIPITOR) 20 MG tablet Take 1 tablet (20 mg total) by mouth at bedtime. 11/12/20  Yes Paz, Alda Berthold, MD  azelastine (ASTELIN) 0.1 % nasal spray Place 2 sprays into both nostrils 2 (two) times daily. 07/17/20  Yes Paz, Alda Berthold, MD  cyclobenzaprine (FLEXERIL) 10 MG tablet Take 1 tablet (10 mg total) by mouth at bedtime as needed for muscle spasms. 07/31/20  Yes Paz, Alda Berthold, MD  fluticasone [facility]) 50 MCG/ACT nasal spray Place 2 sprays into both nostrils daily as needed for allergies or rhinitis. 12/06/20  Yes Paz, Alda Berthold, MD  ibuprofen (ADVIL) 400 MG tablet Take 1 tablet (400 mg  total) by mouth every 6 (six) hours as needed. 07/08/19  Yes Lamptey, Myrene Galas, MD  meclizine (ANTIVERT) 25 MG tablet Take 1 tablet (25 mg total) by mouth 3 (three) times daily as needed for dizziness. 04/03/21  Yes Eliezer Lofts, FNP  valACYclovir (VALTREX) 1000 MG tablet TAKE 2 TABLETS A DAY FOR ONE DAY FOR EACH EPISODE OF COLD SORES. 06/30/19  Yes Colon Branch, MD    Family History Family History  Problem Relation Age of Onset  . Hypertension Mother   . Hyperlipidemia Mother   . Hypertension Father   . Hyperlipidemia Father   . Hyperlipidemia Maternal Grandmother   . CAD Maternal Grandmother   . Diabetes Other        distant relative   . CAD Other           . Prostate cancer Other        GF, passed in his late 30s  . Pancreatic cancer Other        GM, uncle  . Throat cancer Other        GF  . Colon cancer Neg Hx     Social History Social History   Tobacco Use  . Smoking status: Never Smoker  . Smokeless tobacco: Never Used  Vaping Use  . Vaping Use: Never used  Substance Use Topics  .  Alcohol use: Yes    Alcohol/week: 7.0 standard drinks    Types: 7 Cans of beer per week  . Drug use: No    Comment: Previously used marijuana.     Allergies   Patient has no known allergies.   Review of Systems Review of Systems  Constitutional: Negative.   HENT: Negative.   Eyes: Negative.   Respiratory: Negative.   Cardiovascular: Negative.   Gastrointestinal: Negative.   Genitourinary: Negative.   Musculoskeletal: Negative.   Skin: Negative.   Neurological: Positive for dizziness.     Physical Exam Triage Vital Signs ED Triage Vitals  Enc Vitals Group     BP 04/03/21 1026 113/74     Pulse Rate 04/03/21 1026 (!) 57     Resp --      Temp 04/03/21 1026 98.9 F (37.2 C)     Temp Source 04/03/21 1026 Oral     SpO2 04/03/21 1026 97 %     Weight --      Height --      Head Circumference --      Peak Flow --      Pain Score 04/03/21 1027 0     Pain Loc --       Pain Edu? --      Excl. in [place]? --    No data found.  Updated Vital Signs BP 113/74 (BP Location: Right Arm)   Pulse (!) 57   Temp 98.9 F (37.2 C) (Oral)   SpO2 97%       Physical Exam Vitals and nursing note reviewed.  Constitutional:      General: He is not in acute distress.    Appearance: Normal appearance. He is obese. He is not ill-appearing.  HENT:     Head: Normocephalic and atraumatic.     Right Ear: Tympanic membrane, ear canal and external ear normal.     Left Ear: Tympanic membrane, ear canal and external ear normal.     Nose: Nose normal.     Mouth/Throat:     Mouth: Mucous membranes are moist.     Pharynx: Oropharynx is clear.  Eyes:     Extraocular Movements: Extraocular movements intact.     Conjunctiva/sclera: Conjunctivae normal.     Pupils: Pupils are equal, round, and reactive to light.  Neck:     Vascular: No carotid bruit.     Comments: No JVD Cardiovascular:     Rate and Rhythm: Normal rate and regular rhythm.     Pulses: Normal pulses.     Heart sounds: Normal heart sounds. No murmur heard. No gallop.   Pulmonary:     Effort: Pulmonary effort is normal.     Breath sounds: Normal breath sounds.     Comments: No adventitious breath sounds noted Musculoskeletal:     Cervical back: Normal range of motion and neck supple. No rigidity or tenderness.  Lymphadenopathy:     Cervical: No cervical adenopathy.  Skin:    General: Skin is warm and dry.  Neurological:     General: No focal deficit present.     Mental Status: He is alert and oriented to person, place, and time.  Psychiatric:        Mood and Affect: Mood normal.        Behavior: Behavior normal.      UC Treatments / Results  Labs (all labs ordered are listed, but only abnormal results are displayed) Labs Reviewed - No data to display  EKG   Radiology No results found.  Procedures Procedures (including critical care time)  Medications Ordered in UC Medications - No data  to display  Initial Impression / Assessment and Plan / UC Course  I have reviewed the triage vital signs and the nursing notes.  Pertinent labs & imaging results that were available during my care of the patient were reviewed by me and considered in my medical decision making (see chart for details).     MDM: 1.  Dizziness, 2.  Benign positional vertigo.  Patient discharged home, hemodynamically stable. Final Clinical Impressions(s) / UC Diagnoses   Final diagnoses:  Dizziness  Vertigo     Discharge Instructions     Advised/instructed patient to take medication as directed daily, as needed.  Advised patient if symptoms worsen and/or unresolved please follow-up with PCP or here for further evaluation.  Work note provided per patient request.    ED Prescriptions    Medication Sig Dispense Auth. Provider   meclizine (ANTIVERT) 25 MG tablet Take 1 tablet (25 mg total) by mouth 3 (three) times daily as needed for dizziness. 30 tablet Eliezer Lofts, FNP     PDMP not reviewed this encounter.   Eliezer Lofts, [place] 04/03/21 1141

## 2021-04-03 NOTE — ED Triage Notes (Signed)
Patient states that he had an episode of vertigo yesterday.  He's had this happened before about 2 years ago.  Denies any nausea or other sx's today.

## 2021-04-16 ENCOUNTER — Other Ambulatory Visit: Payer: Self-pay

## 2021-04-16 ENCOUNTER — Encounter: Payer: Self-pay | Admitting: Internal Medicine

## 2021-04-16 ENCOUNTER — Ambulatory Visit: Payer: Federal, State, Local not specified - PPO | Admitting: Internal Medicine

## 2021-04-16 VITALS — BP 112/76 | HR 61 | Temp 98.1°F | Resp 16 | Ht 71.0 in | Wt 255.1 lb

## 2021-04-16 DIAGNOSIS — F411 Generalized anxiety disorder: Secondary | ICD-10-CM

## 2021-04-16 DIAGNOSIS — E785 Hyperlipidemia, unspecified: Secondary | ICD-10-CM | POA: Diagnosis not present

## 2021-04-16 MED ORDER — ATORVASTATIN CALCIUM 20 MG PO TABS: 20.0000 mg | ORAL_TABLET | Freq: Every day | ORAL | 0 refills | Status: DC

## 2021-04-16 MED ORDER — ALPRAZOLAM 0.25 MG PO TABS: 0.2500 mg | ORAL_TABLET | Freq: Every evening | ORAL | 2 refills | Status: AC | PRN

## 2021-04-16 NOTE — Progress Notes (Signed)
Subjective:    Patient ID: Brandon Tyler, male    DOB: Feb 28, 1976, 45 y.o.   MRN: [id number]  DOS:  04/16/2021 Type of visit - description: Follow-up Today with talk about insomnia, high cholesterol.  Review of Systems See above   Past Medical History:  Diagnosis Date  . Allergic rhinitis   . Arthritis   . H/O anxiety disorder   . Hyperlipidemia   . Palpitations 2007    saw cards, got a ECHO, told he was ok    Past Surgical History:  Procedure Laterality Date  . TONSILLECTOMY AND ADENOIDECTOMY      Allergies as of 04/16/2021   No Known Allergies     Medication List       Accurate as of April 16, 2021 10:00 AM. If you have any questions, ask your nurse or doctor.        ALPRAZolam 0.25 MG tablet Commonly known as: XANAX Take 1 tablet (0.25 mg total) by mouth at bedtime as needed for anxiety.   atorvastatin 20 MG tablet Commonly known as: LIPITOR Take 1 tablet (20 mg total) by mouth at bedtime.   azelastine 0.1 % nasal spray Commonly known as: ASTELIN Place 2 sprays into both nostrils 2 (two) times daily.   cyclobenzaprine 10 MG tablet Commonly known as: FLEXERIL Take 1 tablet (10 mg total) by mouth at bedtime as needed for muscle spasms.   fluticasone 50 MCG/ACT nasal spray Commonly known as: FLONASE Place 2 sprays into both nostrils daily as needed for allergies or rhinitis.   ibuprofen 400 MG tablet Commonly known as: ADVIL Take 1 tablet (400 mg total) by mouth every 6 (six) hours as needed.   meclizine 25 MG tablet Commonly known as: ANTIVERT Take 1 tablet (25 mg total) by mouth 3 (three) times daily as needed for dizziness.   valACYclovir 1000 MG tablet Commonly known as: VALTREX TAKE 2 TABLETS A DAY FOR ONE DAY FOR EACH EPISODE OF COLD SORES.          Objective:   Physical Exam BP 112/76 (BP Location: Left Arm, Patient Position: Sitting, Cuff Size: Normal)   Pulse 61   Temp 98.1 F (36.7 C) (Oral)   Resp 16   Ht 5\' 11"  (1.803 m)   Wt  255 lb 2 oz (115.7 kg)   SpO2 97%   BMI 35.58 kg/m  General:   Well developed, NAD, BMI noted. HEENT:  Normocephalic . Face symmetric, atraumatic Lungs:  CTA B Normal respiratory effort, no intercostal retractions, no accessory muscle use. Heart: RRR,  no murmur.  Lower extremities: no pretibial edema bilaterally  Skin: Not pale. Not jaundice Neurologic:  alert & oriented X3.  Speech normal, gait appropriate for age and unassisted Psych--  Cognition and judgment appear intact.  Cooperative with normal attention span and concentration.  Behavior appropriate. No anxious or depressed appearing.      Assessment     Assessment: Prediabetes (a1c: 6.3   04/2017) Hyperlipidemia.  Anxiety  Seizure disorder? Saw neuro 12-2015, dx w/ SZ, Rx meds, EEG (-) 12-2015 Palpitations 2007, saw cards, got ECHO Fever blisters (daily valtrex)  PLAN: Hyperlipidemia: Based on last FLP, Lipitor was rx, he took it regularly for a while, no s/e, currently taking it irregularly. Plan: Restart Lipitor, labs in 6 weeks. We had extensive discussion about -diet including portion control and others (see AVS). -exercise: He is active at work, nevertheless recommend 3 hours of physical activity weekly. Anxiety: PDMP okay, Xanax refilled.  RTC November 2022 CPX   This visit occurred during the SARS-CoV-2 public health emergency.  Safety protocols were in place, including screening questions prior to the visit, additional usage of staff PPE, and extensive cleaning of exam room while observing appropriate contact time as indicated for disinfecting solutions.

## 2021-04-16 NOTE — Patient Instructions (Addendum)
Take Lipitor every day in the morning.  Exercise 3 hours weekly  Diet: Think about portion control.  You could use calorie counting (MYFITNESSPAL) , NOOM, or weight watchers   GO TO THE FRONT DESK, PLEASE SCHEDULE YOUR APPOINTMENTS  Come back for   blood work in 6 weeks, fasting.  Make an appointment  Come back for your physical exam by November 2022

## 2021-04-17 NOTE — Assessment & Plan Note (Signed)
Hyperlipidemia: Based on last FLP, Lipitor was rx, he took it regularly for a while, no s/e, currently taking it irregularly. Plan: Restart Lipitor, labs in 6 weeks. We had extensive discussion about -diet including portion control and others (see AVS). -exercise: He is active at work, nevertheless recommend 3 hours of physical activity weekly. Anxiety: PDMP okay, Xanax refilled. RTC November 2022 CPX

## 2021-04-28 DIAGNOSIS — L814 Other melanin hyperpigmentation: Secondary | ICD-10-CM | POA: Diagnosis not present

## 2021-04-28 DIAGNOSIS — L218 Other seborrheic dermatitis: Secondary | ICD-10-CM | POA: Diagnosis not present

## 2021-04-28 DIAGNOSIS — L811 Chloasma: Secondary | ICD-10-CM | POA: Diagnosis not present

## 2021-04-30 ENCOUNTER — Encounter: Payer: Self-pay | Admitting: Internal Medicine

## 2021-05-21 ENCOUNTER — Other Ambulatory Visit (INDEPENDENT_AMBULATORY_CARE_PROVIDER_SITE_OTHER): Payer: Federal, State, Local not specified - PPO

## 2021-05-21 ENCOUNTER — Other Ambulatory Visit: Payer: Self-pay

## 2021-05-21 DIAGNOSIS — E785 Hyperlipidemia, unspecified: Secondary | ICD-10-CM

## 2021-05-21 LAB — ALT: ALT: 35 U/L (ref 0–53)

## 2021-05-21 LAB — LIPID PANEL
Cholesterol: 155 mg/dL (ref 0–200)
HDL: 36.2 mg/dL — ABNORMAL LOW (ref >39.00)
LDL Cholesterol: 99 mg/dL (ref 0–99)
NonHDL: 119
Total CHOL/HDL Ratio: 4
Triglycerides: 101 mg/dL (ref 0.0–149.0)
VLDL: 20.2 mg/dL (ref 0.0–40.0)

## 2021-05-21 LAB — AST: AST: 33 U/L (ref 0–37)

## 2021-05-22 MED ORDER — ATORVASTATIN CALCIUM 20 MG PO TABS: 20.0000 mg | ORAL_TABLET | Freq: Every day | ORAL | 3 refills | Status: DC

## 2021-05-22 NOTE — Addendum Note (Signed)
Addended byDamita Dunnings D on: 05/22/2021 01:47 PM   Modules accepted: Orders

## 2021-06-14 DIAGNOSIS — F419 Anxiety disorder, unspecified: Secondary | ICD-10-CM | POA: Diagnosis not present

## 2021-06-29 ENCOUNTER — Other Ambulatory Visit: Payer: Self-pay | Admitting: Internal Medicine

## 2021-08-24 ENCOUNTER — Other Ambulatory Visit: Payer: Self-pay | Admitting: Internal Medicine

## 2021-10-08 ENCOUNTER — Encounter: Payer: Federal, State, Local not specified - PPO | Admitting: Internal Medicine

## 2021-10-26 ENCOUNTER — Other Ambulatory Visit: Payer: Self-pay | Admitting: Internal Medicine

## 2021-11-04 ENCOUNTER — Ambulatory Visit (INDEPENDENT_AMBULATORY_CARE_PROVIDER_SITE_OTHER): Payer: Federal, State, Local not specified - PPO | Admitting: Internal Medicine

## 2021-11-04 ENCOUNTER — Encounter: Payer: Self-pay | Admitting: Internal Medicine

## 2021-11-04 VITALS — BP 122/80 | HR 59 | Temp 98.0°F | Resp 16 | Ht 71.0 in | Wt 263.0 lb

## 2021-11-04 DIAGNOSIS — E785 Hyperlipidemia, unspecified: Secondary | ICD-10-CM

## 2021-11-04 DIAGNOSIS — Z0000 Encounter for general adult medical examination without abnormal findings: Secondary | ICD-10-CM | POA: Diagnosis not present

## 2021-11-04 DIAGNOSIS — R739 Hyperglycemia, unspecified: Secondary | ICD-10-CM | POA: Diagnosis not present

## 2021-11-04 NOTE — Patient Instructions (Signed)
Vaccines are recommended: COVID booster Flu shot  If you have back pain:  IBUPROFEN (Advil or Motrin) 200 mg 2 tablets once daily needed for pain.  Always take it with food because may cause gastritis and ulcers.  If you notice nausea, stomach pain, change in the color of stools --->  Stop the medicine and let us know   GO TO THE LAB : Get the blood work     Brandon Tyler, [place] back for a physical exam in 1 year

## 2021-11-04 NOTE — Progress Notes (Signed)
Subjective:    Patient ID: Brandon Tyler, male    DOB: Nov 24, 1975, 45 y.o.   MRN: [id number]  DOS:  11/04/2021 Type of visit - description: cpx  Since the last visit is doing well. Good compliance with medications. Stress has decreased significantly. Still has occasional low back pain with lifting at work.   Wt Readings from Last 3 Encounters:  11/04/21 263 lb (119.3 kg)  04/16/21 255 lb 2 oz (115.7 kg)  10/02/20 257 lb 6 oz (116.7 kg)    Review of Systems  Other than above, a 14 point review of systems is negative    Past Medical History:  Diagnosis Date   Allergic rhinitis    Arthritis    H/O anxiety disorder    Hyperlipidemia    Palpitations 2007    saw cards, got a ECHO, told he was ok    Past Surgical History:  Procedure Laterality Date   TONSILLECTOMY AND ADENOIDECTOMY     Social History   Socioeconomic History   Marital status: Married    Spouse name: Not on file   Number of children: 2   Years of education: 16   Highest education level: Not on file  Occupational History   Occupation: Conservator, museum/gallery , physical work  Tobacco Use   Smoking status: Never   Smokeless tobacco: Never  Vaping Use   Vaping Use: Never used  Substance and Sexual Activity   Alcohol use: Yes    Alcohol/week: 7.0 standard drinks    Types: 7 Cans of beer per week   Drug use: No    Comment: Previously used marijuana.   Sexual activity: Not on file  Other Topics Concern   Not on file  Social History Narrative   Lives w/ wife and 2 children  , 2009 and 2006   Right-handed.   Drinks 3 diet sodas per day.   Social Determinants of Health   Financial Resource Strain: Not on file  Food Insecurity: Not on file  Transportation Needs: Not on file  Physical Activity: Not on file  Stress: Not on file  Social Connections: Not on file  Intimate Partner Violence: Not on file    Allergies as of 11/04/2021   No Known Allergies      Medication List        Accurate as of  November 04, 2021 11:59 PM. If you have any questions, ask your nurse or doctor.          ALPRAZolam 0.25 MG tablet Commonly known as: XANAX Take 1 tablet (0.25 mg total) by mouth at bedtime as needed for anxiety.   atorvastatin 20 MG tablet Commonly known as: LIPITOR TAKE 1 TABLET(20 MG) BY MOUTH AT BEDTIME   azelastine 0.1 % nasal spray Commonly known as: ASTELIN Place 2 sprays into both nostrils 2 (two) times daily.   fluticasone 50 MCG/ACT nasal spray Commonly known as: FLONASE SHAKE LIQUID AND USE 2 SPRAYS IN EACH NOSTRIL DAILY AS NEEDED FOR ALLERGIES OR RHINITIS   ibuprofen 400 MG tablet Commonly known as: ADVIL Take 1 tablet (400 mg total) by mouth every 6 (six) hours as needed.   valACYclovir 1000 MG tablet Commonly known as: VALTREX TAKE 2 TABLETS A DAY FOR ONE DAY FOR EACH EPISODE OF COLD SORES.           Objective:   Physical Exam BP 122/80 (BP Location: Left Arm, Patient Position: Sitting, Cuff Size: Normal)    Pulse (!) 59    Temp  18 F (36.7 C) (Oral)    Resp 16    Ht 5\' 11"  (1.803 m)    Wt 263 lb (119.3 kg)    SpO2 97%    BMI 36.68 kg/m  General: Well developed, NAD, BMI noted Neck: No  thyromegaly  HEENT:  Normocephalic . Face symmetric, atraumatic Lungs:  CTA B Normal respiratory effort, no intercostal retractions, no accessory muscle use. Heart: RRR,  no murmur.  Abdomen:  Not distended, soft, non-tender. No rebound or rigidity.   Lower extremities: no pretibial edema bilaterally  Skin: Exposed areas without rash. Not pale. Not jaundice Neurologic:  alert & oriented X3.  Speech normal, gait appropriate for age and unassisted Strength symmetric and appropriate for age.  Psych: Cognition and judgment appear intact.  Cooperative with normal attention span and concentration.  Behavior appropriate. No anxious or depressed appearing.     Assessment      Assessment: Prediabetes (a1c: 6.3   04/2017) Hyperlipidemia.  Anxiety  Seizure  disorder? Saw neuro 12-2015, dx w/ SZ, Rx meds, EEG (-) 12-2015 Palpitations 2007, saw cards, got ECHO Fever blisters (daily valtrex)  PLAN: Here for CPX Prediabetes: Explained the concept of prediabetes to the patient, encourage diet and exercise.  Check A1c Hyperlipidemia: On atorvastatin, excellent results, recheck labs. Anxiety: Stress significantly decreased.  Takes Xanax sporadically. Low back pain:  sxs for a while, mostly when lifting at work, would like something to take as needed, on chart review ibuprofen is effective, recommend to continue nsaids w/ GI precautions.  See AVS.  If not improving consider further eval. Snoring: Wife noted that the patient sometimes snores.  He admits to occasional fatigue but not necessarily sleepy.  He works third shift, after work he helps with her children.  Unclear if snoring is clinically meaningful.  We agreed on observation for now. RTC 1 year     This visit occurred during the SARS-CoV-2 public health emergency.  Safety protocols were in place, including screening questions prior to the visit, additional usage of staff PPE, and extensive cleaning of exam room while observing appropriate contact time as indicated for disinfecting solutions.

## 2021-11-05 ENCOUNTER — Encounter: Payer: Self-pay | Admitting: Internal Medicine

## 2021-11-05 LAB — CBC WITH DIFFERENTIAL/PLATELET
Basophils Absolute: 0 10*3/uL (ref 0.0–0.1)
Basophils Relative: 1 % (ref 0.0–3.0)
Eosinophils Absolute: 0.2 10*3/uL (ref 0.0–0.7)
Eosinophils Relative: 4.6 % (ref 0.0–5.0)
HCT: 42.8 % (ref 39.0–52.0)
Hemoglobin: 14.5 g/dL (ref 13.0–17.0)
Lymphocytes Relative: 33.9 % (ref 12.0–46.0)
Lymphs Abs: 1.3 10*3/uL (ref 0.7–4.0)
MCHC: 33.9 g/dL (ref 30.0–36.0)
MCV: 91.3 fl (ref 78.0–100.0)
Monocytes Absolute: 0.3 10*3/uL (ref 0.1–1.0)
Monocytes Relative: 8.7 % (ref 3.0–12.0)
Neutro Abs: 2 10*3/uL (ref 1.4–7.7)
Neutrophils Relative %: 51.8 % (ref 43.0–77.0)
Platelets: 200 10*3/uL (ref 150.0–400.0)
RBC: 4.69 Mil/uL (ref 4.22–5.81)
RDW: 13.5 % (ref 11.5–15.5)
WBC: 3.9 10*3/uL — ABNORMAL LOW (ref 4.0–10.5)

## 2021-11-05 LAB — COMPREHENSIVE METABOLIC PANEL
ALT: 32 U/L (ref 0–53)
AST: 25 U/L (ref 0–37)
Albumin: 4.3 g/dL (ref 3.5–5.2)
Alkaline Phosphatase: 36 U/L — ABNORMAL LOW (ref 39–117)
BUN: 15 mg/dL (ref 6–23)
CO2: 27 mEq/L (ref 19–32)
Calcium: 9.5 mg/dL (ref 8.4–10.5)
Chloride: 108 mEq/L (ref 96–112)
Creatinine, Ser: 1.23 mg/dL (ref 0.40–1.50)
GFR: 71.72 mL/min (ref >60.00)
Glucose, Bld: 110 mg/dL — ABNORMAL HIGH (ref 70–99)
Potassium: 4.2 mEq/L (ref 3.5–5.1)
Sodium: 142 mEq/L (ref 135–145)
Total Bilirubin: 0.5 mg/dL (ref 0.2–1.2)
Total Protein: 6.7 g/dL (ref 6.0–8.3)

## 2021-11-05 LAB — LIPID PANEL
Cholesterol: 155 mg/dL (ref 0–200)
HDL: 36.6 mg/dL — ABNORMAL LOW (ref >39.00)
LDL Cholesterol: 91 mg/dL (ref 0–99)
NonHDL: 118.37
Total CHOL/HDL Ratio: 4
Triglycerides: 138 mg/dL (ref 0.0–149.0)
VLDL: 27.6 mg/dL (ref 0.0–40.0)

## 2021-11-05 LAB — HEMOGLOBIN A1C: Hgb A1c MFr Bld: 6.4 % (ref 4.6–6.5)

## 2021-11-05 NOTE — Assessment & Plan Note (Signed)
Here for CPX Prediabetes: Explained the concept of prediabetes to the patient, encourage diet and exercise.  Check A1c Hyperlipidemia: On atorvastatin, excellent results, recheck labs. Anxiety: Stress significantly decreased.  Takes Xanax sporadically. Low back pain:  sxs for a while, mostly when lifting at work, would like something to take as needed, on chart review ibuprofen is effective, recommend to continue nsaids w/ GI precautions.  See AVS.  If not improving consider further eval. Snoring: Wife noted that the patient sometimes snores.  He admits to occasional fatigue but not necessarily sleepy.  He works third shift, after work he helps with her children.  Unclear if snoring is clinically meaningful.  We agreed on observation for now. RTC 1 year

## 2021-11-05 NOTE — Assessment & Plan Note (Signed)
-  Tdap 2016 - Covid vax x 2, rec booster   - flu shot: declined  --CCS: No family history of colon cancer --Prostate cancer screening: Grandfather had prostate cancer at a very advanced age. --Diet and exercise discussed -- Labs: CMP, FLP, CBC, A1c

## 2022-07-22 ENCOUNTER — Ambulatory Visit: Payer: Federal, State, Local not specified - PPO

## 2022-08-10 ENCOUNTER — Ambulatory Visit (HOSPITAL_COMMUNITY)
Admission: EM | Admit: 2022-08-10 | Discharge: 2022-08-10 | Disposition: A | Discharge status: Home / Self Care | Payer: Federal, State, Local not specified - PPO | Attending: Emergency Medicine | Admitting: Emergency Medicine

## 2022-08-10 ENCOUNTER — Encounter (HOSPITAL_COMMUNITY): Payer: Self-pay | Admitting: Emergency Medicine

## 2022-08-10 DIAGNOSIS — S161XXA Strain of muscle, fascia and tendon at neck level, initial encounter: Secondary | ICD-10-CM

## 2022-08-10 MED ORDER — KETOROLAC TROMETHAMINE 30 MG/ML IJ SOLN
30.0000 mg | Freq: Once | INTRAMUSCULAR | Status: AC
  Administered 2022-08-10: 30 mg via INTRAMUSCULAR

## 2022-08-10 MED ORDER — PREDNISONE 20 MG PO TABS: 40.0000 mg | ORAL_TABLET | Freq: Every day | ORAL | 0 refills | Status: DC

## 2022-08-10 MED ORDER — KETOROLAC TROMETHAMINE 30 MG/ML IJ SOLN
INTRAMUSCULAR | Status: AC
  Filled 2022-08-10: qty 1

## 2022-08-10 MED ORDER — CYCLOBENZAPRINE HCL 10 MG PO TABS: 10.0000 mg | ORAL_TABLET | Freq: Two times a day (BID) | ORAL | 0 refills | Status: DC | PRN

## 2022-08-10 NOTE — ED Provider Notes (Signed)
[facility]    CSN: [id number] Arrival date & time: 08/10/22  1607      History   Chief Complaint Chief Complaint  Patient presents with   Neck Pain   Shoulder Pain    HPI Brandon Tyler is a 46 y.o. male.   Patient presents with left-sided neck pain radiating down the left arm for 2 weeks.  Pain has been constant and fluctuating in intensity, describes as a aching pain with intermittent sharp shooting pains and intermittent tingling.  Pain can be worsened by turning head to the left.  Pain slightly improves when arm is raised above head but when lowered it causes a sensation of heaviness.  Denies injury or trauma but does frequently bowl.  Has attempted use of Tylenol, NSAIDs, heat, ice, rubbing alcohol and massage which have been ineffective.    Past Medical History:  Diagnosis Date   Allergic rhinitis    Arthritis    H/O anxiety disorder    Hyperlipidemia    Palpitations 2007    saw cards, got a ECHO, told he was ok    Patient Active Problem List   Diagnosis Date Noted   History of cold sores 01/27/2019   Prediabetes 02/17/2018   Right knee pain 01/12/2017   Hyperlipidemia 06/14/2016   PCP NOTES >>>>>>>>>>>>>>>>>>> 04/05/2016   Confusion 12/24/2015   Left knee pain 12/05/2015   Annual physical exam 01/26/2013   ALLERGIC RHINITIS 02/10/2010   Anxiety state 04/18/2009    Past Surgical History:  Procedure Laterality Date   TONSILLECTOMY AND ADENOIDECTOMY         Home Medications    Prior to Admission medications   Medication Sig Start Date End Date Taking? Authorizing Provider  ALPRAZolam (XANAX) 0.25 MG tablet Take 1 tablet (0.25 mg total) by mouth at bedtime as needed for anxiety. Patient not taking: Reported on 11/04/2021 04/16/21   Colon Branch, MD  atorvastatin (LIPITOR) 20 MG tablet TAKE 1 TABLET(20 MG) BY MOUTH AT BEDTIME 10/27/21   Colon Branch, MD  azelastine (ASTELIN) 0.1 % nasal spray Place 2 sprays into both nostrils 2 (two) times  daily. 07/17/20   Colon Branch, MD  fluticasone (FLONASE) 50 MCG/ACT nasal spray SHAKE LIQUID AND USE 2 SPRAYS IN EACH NOSTRIL DAILY AS NEEDED FOR ALLERGIES OR RHINITIS 08/25/21   Colon Branch, MD  ibuprofen (ADVIL) 400 MG tablet Take 1 tablet (400 mg total) by mouth every 6 (six) hours as needed. Patient not taking: Reported on 04/16/2021 07/08/19   Chase Picket, MD  valACYclovir (VALTREX) 1000 MG tablet TAKE 2 TABLETS A DAY FOR ONE DAY FOR EACH EPISODE OF COLD SORES. 06/30/19   Colon Branch, MD    Family History Family History  Problem Relation Age of Onset   Hypertension Mother    Hyperlipidemia Mother    Hypertension Father    Hyperlipidemia Father    Hyperlipidemia Maternal Grandmother    CAD Maternal Grandmother    Diabetes Other        distant relative    CAD Other            Prostate cancer Other        GF, passed in his late 54s   Pancreatic cancer Other        GM, uncle   Throat cancer Other        GF   Colon cancer Neg Hx     Social History Social History   Tobacco  Use   Smoking status: Never   Smokeless tobacco: Never  Vaping Use   Vaping Use: Never used  Substance Use Topics   Alcohol use: Yes    Alcohol/week: 7.0 standard drinks of alcohol    Types: 7 Cans of beer per week   Drug use: No    Comment: Previously used marijuana.     Allergies   Patient has no known allergies.   Review of Systems Review of Systems  Constitutional: Negative.   Respiratory: Negative.    Cardiovascular: Negative.   Musculoskeletal:  Positive for neck pain. Negative for arthralgias, back pain, gait problem, joint swelling, myalgias and neck stiffness.  Skin: Negative.   Neurological: Negative.      Physical Exam Triage Vital Signs ED Triage Vitals  Enc Vitals Group     BP --      Pulse Rate 08/10/22 1628 84     Resp 08/10/22 1628 17     Temp 08/10/22 1628 99.2 F (37.3 C)     Temp Source 08/10/22 1628 Oral     SpO2 08/10/22 1628 95 %     Weight --      Height  --      Head Circumference --      Peak Flow --      Pain Score 08/10/22 1627 8     Pain Loc --      Pain Edu? --      Excl. in [place]? --    No data found.  Updated Vital Signs Pulse 84   Temp 99.2 F (37.3 C) (Oral)   Resp 17   SpO2 95%   Visual Acuity Right Eye Distance:   Left Eye Distance:   Bilateral Distance:    Right Eye Near:   Left Eye Near:    Bilateral Near:     Physical Exam Constitutional:      Appearance: Normal appearance.  HENT:     Head: Normocephalic.  Eyes:     Extraocular Movements: Extraocular movements intact.  Pulmonary:     Effort: Pulmonary effort is normal.  Musculoskeletal:     Comments: Unable to reproduce tenderness on exam, no ecchymosis, swelling or deformity, range of motion of the neck is intact, range of motion of the left shoulder is intact, 2+ carotid pulse, sensation is intact, strength is a 5 out of 5  Neurological:     Mental Status: He is alert and oriented to person, place, and time. Mental status is at baseline.  Psychiatric:        Mood and Affect: Mood normal.        Behavior: Behavior normal.      UC Treatments / Results  Labs (all labs ordered are listed, but only abnormal results are displayed) Labs Reviewed - No data to display  EKG   Radiology No results found.  Procedures Procedures (including critical care time)  Medications Ordered in UC Medications - No data to display  Initial Impression / Assessment and Plan / UC Course  I have reviewed the triage vital signs and the nursing notes.  Pertinent labs & imaging results that were available during my care of the patient were reviewed by me and considered in my medical decision making (see chart for details).  Strain of neck muscle, initial encounter  Etiology is most likely muscular, unable to produce pain on examination but patient no takes that pain is to the lateral aspect without spinal involvement, Toradol injection given in office and  prescribed  prednisone and Flexeril for outpatient use, recommended continued RICE and additional supportive measures, given walking referral to orthopedics if symptoms persist or worsen Final Clinical Impressions(s) / UC Diagnoses   Final diagnoses:  None   Discharge Instructions   None    ED Prescriptions   None    PDMP not reviewed this encounter.   Hans Eden, NP 08/10/22 [phone]

## 2022-08-10 NOTE — Discharge Instructions (Signed)
Your pain is most likely caused by irritation to the muscles just compressing the nerve causing it to radiate down your arm  You have been given an injection of Toradol today to help reduce inflammation that naturally occurs with injury which helps your pain, you may take the muscle relaxer and Tylenol tonight when you return home  Starting tomorrow take prednisone every day for 5 days, this medicine has the potential to make it difficult for you to sleep therefore take during her normal "daytime hours' as you work third shift  You may use muscle relaxer twice daily as needed for additional comfort, be mindful this medication may make you drowsy  You may use heating pad in 15 minute intervals as needed for additional comfort,or  you may find comfort in using ice in 10-15 minutes over affected area  Begin stretching affected area daily for 10 minutes as tolerated to further loosen muscles   When lying down place pillow underneath arm and behind back  Can try sleeping without pillow on firm mattress   Practice good posture: head back, shoulders back, chest forward, pelvis back and weight distributed evenly on both legs  If pain persist after recommended treatment or reoccurs if may be beneficial to follow up with orthopedic specialist for evaluation, this doctor specializes in the bones and can manage your symptoms long-term with options such as but not limited to imaging, medications or physical therapy

## 2022-08-10 NOTE — ED Triage Notes (Addendum)
Pt reports that started having neck pains and now traveling to left shoulder and down left arm for 2 weeks. Nothing makes pain better or worse. Denies falls or injury or lifting. Tried tylenol and ibuprofen as well as ice and heat.

## 2022-08-14 ENCOUNTER — Encounter: Payer: Self-pay | Admitting: Internal Medicine

## 2022-08-14 ENCOUNTER — Ambulatory Visit: Payer: Federal, State, Local not specified - PPO | Admitting: Internal Medicine

## 2022-08-14 VITALS — BP 126/82 | HR 66 | Temp 98.1°F | Resp 16 | Ht 71.0 in | Wt 259.2 lb

## 2022-08-14 DIAGNOSIS — M542 Cervicalgia: Secondary | ICD-10-CM | POA: Diagnosis not present

## 2022-08-14 DIAGNOSIS — M5412 Radiculopathy, cervical region: Secondary | ICD-10-CM | POA: Diagnosis not present

## 2022-08-14 NOTE — Patient Instructions (Signed)
We are referring you to the orthopedic doctor  Finish prednisone  Continue  the muscle relaxant.  For pain:  Tylenol  500 mg OTC 2 tabs a day every 8 hours as needed for pain In the addition to Tylenol, you can also take some ibuprofen, try to limit the amount of ibuprofen you take because it can cause gastritis.  IBUPROFEN (Advil or Motrin) 200 mg 2 tablets every 12 hours as needed for pain.  Always take it with food because may cause gastritis and ulcers.  If you notice nausea, stomach pain, change in the color of stools --->  Stop the medicine and let us know  Heating pad twice daily  Call if symptoms worsen

## 2022-08-14 NOTE — Progress Notes (Signed)
   Subjective:    Patient ID: Brandon Tyler, male    DOB: 05/13/1976, 46 y.o.   MRN: [id number]  DOS:  08/14/2022 Type of visit - description: Acute visit  Symptoms started approximately 07/26/2022.  He woke up with pain at the left side of the neck, the  following days the pain started to radiate to the left deltoid area. Went to a urgent care 9-25, Rx prednisone, Flexeril. Pain is not better, he actually developed paresthesias in the left arm 01/12/2022.  The paresthesias go to the thumb. Pain is worse with head movement.  Denies other aches pains, no other paresthesias.  Review of Systems See above   Past Medical History:  Diagnosis Date   Allergic rhinitis    Arthritis    H/O anxiety disorder    Hyperlipidemia    Palpitations 2007    saw cards, got a ECHO, told he was ok    Past Surgical History:  Procedure Laterality Date   TONSILLECTOMY AND ADENOIDECTOMY      Current Outpatient Medications  Medication Instructions   ALPRAZolam (XANAX) 0.25 mg, Oral, At bedtime PRN   atorvastatin (LIPITOR) 20 MG tablet TAKE 1 TABLET(20 MG) BY MOUTH AT BEDTIME   azelastine (ASTELIN) 0.1 % nasal spray 2 sprays, Each Nare, 2 times daily   cyclobenzaprine (FLEXERIL) 10 mg, Oral, 2 times daily PRN   fluticasone (FLONASE) 50 MCG/ACT nasal spray SHAKE LIQUID AND USE 2 SPRAYS IN EACH NOSTRIL DAILY AS NEEDED FOR ALLERGIES OR RHINITIS   ibuprofen (ADVIL) 400 mg, Oral, Every 6 hours PRN   predniSONE (DELTASONE) 40 mg, Oral, Daily   valACYclovir (VALTREX) 1000 MG tablet TAKE 2 TABLETS A DAY FOR ONE DAY FOR EACH EPISODE OF COLD SORES.       Objective:   Physical Exam BP 126/82   Pulse 66   Temp 98.1 F (36.7 C) (Oral)   Resp 16   Ht '5\' 11"'$  (1.803 m)   Wt 259 lb 4 oz (117.6 kg)   SpO2 99%   BMI 36.16 kg/m  General:   Well developed, NAD, BMI noted. Neck: No TTP.  Range of motion normal but reports pain when he turns his left to the left or right. Lower extremities: no pretibial edema  bilaterally  Skin: Not pale. Not jaundice Neurologic:  alert & oriented X3.  Speech normal, gait appropriate for age and unassisted Motor and DTRs: Symmetric Psych--  Cognition and judgment appear intact.  Cooperative with normal attention span and concentration.  Behavior appropriate. No anxious or depressed appearing.      Assessment     Assessment: Prediabetes (a1c: 6.3   04/2017) Hyperlipidemia.  Anxiety  Seizure disorder? Saw neuro 12-2015, dx w/ SZ, Rx meds, EEG (-) 12-2015 Palpitations 2007, saw cards, got ECHO Fever blisters (daily valtrex)  PLAN: Neck pain: Started 07/26/2022, in the last 2 days has developed symptoms consistent with a radiculopathy, possibly C6 distribution as it includes the thumb. No motor deficits on exam. Plan:  Finish prednisone, Tylenol, ibuprofen, heating pad, see AVS. 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  We will fill FMLA papers for 2 weeks.

## 2022-08-14 NOTE — Assessment & Plan Note (Signed)
Neck pain: Started 07/26/2022, in the last 2 days has developed symptoms consistent with a radiculopathy, possibly C6 distribution as it includes the thumb. No motor deficits on exam. Plan:  Finish prednisone, Tylenol, ibuprofen, heating pad, see AVS. 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  We will fill FMLA papers for 2 weeks.

## 2022-08-17 ENCOUNTER — Telehealth: Payer: Self-pay

## 2022-08-17 DIAGNOSIS — Z0279 Encounter for issue of other medical certificate: Secondary | ICD-10-CM

## 2022-08-17 NOTE — Telephone Encounter (Signed)
Received fax confirmation

## 2022-08-17 NOTE — Telephone Encounter (Signed)
FMLA paperwork completed and faxed back to [phone]. Form sent for scanning.

## 2022-08-17 NOTE — Telephone Encounter (Signed)
ST forms completed and faxed back to [facility] at [phone]. Form sent for scanning.    Received fax confirmation.

## 2022-08-20 DIAGNOSIS — M5412 Radiculopathy, cervical region: Secondary | ICD-10-CM | POA: Diagnosis not present

## 2022-08-20 DIAGNOSIS — M542 Cervicalgia: Secondary | ICD-10-CM | POA: Diagnosis not present

## 2022-08-21 ENCOUNTER — Other Ambulatory Visit: Payer: Self-pay | Admitting: Orthopedic Surgery

## 2022-08-21 DIAGNOSIS — M542 Cervicalgia: Secondary | ICD-10-CM

## 2022-09-06 ENCOUNTER — Ambulatory Visit
Admission: RE | Admit: 2022-09-06 | Discharge: 2022-09-06 | Disposition: A | Discharge status: Home / Self Care | Payer: Federal, State, Local not specified - PPO | Source: Ambulatory Visit | Attending: Orthopedic Surgery | Admitting: Orthopedic Surgery

## 2022-09-06 DIAGNOSIS — M542 Cervicalgia: Secondary | ICD-10-CM

## 2022-09-06 DIAGNOSIS — M4802 Spinal stenosis, cervical region: Secondary | ICD-10-CM | POA: Diagnosis not present

## 2022-09-11 ENCOUNTER — Other Ambulatory Visit: Payer: Self-pay | Admitting: Internal Medicine

## 2022-09-14 DIAGNOSIS — G935 Compression of brain: Secondary | ICD-10-CM | POA: Insufficient documentation

## 2022-09-14 DIAGNOSIS — M5030 Other cervical disc degeneration, unspecified cervical region: Secondary | ICD-10-CM | POA: Insufficient documentation

## 2022-09-14 DIAGNOSIS — M542 Cervicalgia: Secondary | ICD-10-CM | POA: Diagnosis not present

## 2022-09-14 DIAGNOSIS — M5412 Radiculopathy, cervical region: Secondary | ICD-10-CM | POA: Insufficient documentation

## 2022-09-14 DIAGNOSIS — M4802 Spinal stenosis, cervical region: Secondary | ICD-10-CM | POA: Insufficient documentation

## 2022-09-18 ENCOUNTER — Telehealth: Payer: Self-pay

## 2022-09-18 NOTE — Telephone Encounter (Signed)
Received disability paperwork from [place]. Per PCP- forms need to be completed by ortho. Form faxed back to [place] at [phone] asking that they send to ortho. Form will be shredded.

## 2022-09-23 DIAGNOSIS — M5412 Radiculopathy, cervical region: Secondary | ICD-10-CM | POA: Diagnosis not present

## 2022-09-23 DIAGNOSIS — M542 Cervicalgia: Secondary | ICD-10-CM | POA: Diagnosis not present

## 2022-09-24 DIAGNOSIS — M5412 Radiculopathy, cervical region: Secondary | ICD-10-CM | POA: Diagnosis not present

## 2022-09-24 DIAGNOSIS — Z6838 Body mass index (BMI) 38.0-38.9, adult: Secondary | ICD-10-CM | POA: Diagnosis not present

## 2022-09-24 DIAGNOSIS — M542 Cervicalgia: Secondary | ICD-10-CM | POA: Diagnosis not present

## 2022-09-29 DIAGNOSIS — M5412 Radiculopathy, cervical region: Secondary | ICD-10-CM | POA: Diagnosis not present

## 2022-09-29 DIAGNOSIS — M542 Cervicalgia: Secondary | ICD-10-CM | POA: Diagnosis not present

## 2022-10-02 DIAGNOSIS — M542 Cervicalgia: Secondary | ICD-10-CM | POA: Diagnosis not present

## 2022-10-02 DIAGNOSIS — M5412 Radiculopathy, cervical region: Secondary | ICD-10-CM | POA: Diagnosis not present

## 2022-10-06 DIAGNOSIS — M542 Cervicalgia: Secondary | ICD-10-CM | POA: Diagnosis not present

## 2022-10-06 DIAGNOSIS — M5412 Radiculopathy, cervical region: Secondary | ICD-10-CM | POA: Diagnosis not present

## 2022-10-13 DIAGNOSIS — M5412 Radiculopathy, cervical region: Secondary | ICD-10-CM | POA: Diagnosis not present

## 2022-10-13 DIAGNOSIS — M542 Cervicalgia: Secondary | ICD-10-CM | POA: Diagnosis not present

## 2022-10-16 DIAGNOSIS — M5412 Radiculopathy, cervical region: Secondary | ICD-10-CM | POA: Diagnosis not present

## 2022-10-16 DIAGNOSIS — M542 Cervicalgia: Secondary | ICD-10-CM | POA: Diagnosis not present

## 2022-10-23 DIAGNOSIS — M5412 Radiculopathy, cervical region: Secondary | ICD-10-CM | POA: Diagnosis not present

## 2022-10-23 DIAGNOSIS — M542 Cervicalgia: Secondary | ICD-10-CM | POA: Diagnosis not present

## 2022-10-28 ENCOUNTER — Telehealth: Payer: Self-pay

## 2022-10-28 NOTE — Telephone Encounter (Signed)
Received disability paperwork again from [place]. Per PCP- forms need to be completed by ortho. Form faxed back to [place] at [phone] asking that they send to ortho. Form will be shredded.

## 2022-11-06 ENCOUNTER — Encounter: Payer: Federal, State, Local not specified - PPO | Admitting: Internal Medicine

## 2022-11-19 ENCOUNTER — Ambulatory Visit (INDEPENDENT_AMBULATORY_CARE_PROVIDER_SITE_OTHER): Payer: Federal, State, Local not specified - PPO | Admitting: Internal Medicine

## 2022-11-19 ENCOUNTER — Encounter: Payer: Self-pay | Admitting: Internal Medicine

## 2022-11-19 VITALS — BP 124/82 | HR 64 | Temp 97.7°F | Resp 18 | Ht 71.0 in | Wt 268.2 lb

## 2022-11-19 DIAGNOSIS — R739 Hyperglycemia, unspecified: Secondary | ICD-10-CM

## 2022-11-19 DIAGNOSIS — E785 Hyperlipidemia, unspecified: Secondary | ICD-10-CM

## 2022-11-19 DIAGNOSIS — Z09 Encounter for follow-up examination after completed treatment for conditions other than malignant neoplasm: Secondary | ICD-10-CM | POA: Diagnosis not present

## 2022-11-19 DIAGNOSIS — Z0000 Encounter for general adult medical examination without abnormal findings: Secondary | ICD-10-CM

## 2022-11-19 LAB — COMPREHENSIVE METABOLIC PANEL
ALT: 33 U/L (ref 0–53)
AST: 24 U/L (ref 0–37)
Albumin: 4.6 g/dL (ref 3.5–5.2)
Alkaline Phosphatase: 39 U/L (ref 39–117)
BUN: 14 mg/dL (ref 6–23)
CO2: 28 mEq/L (ref 19–32)
Calcium: 9.3 mg/dL (ref 8.4–10.5)
Chloride: 106 mEq/L (ref 96–112)
Creatinine, Ser: 1.32 mg/dL (ref 0.40–1.50)
GFR: 65.42 mL/min (ref >60.00)
Glucose, Bld: 106 mg/dL — ABNORMAL HIGH (ref 70–99)
Potassium: 3.9 mEq/L (ref 3.5–5.1)
Sodium: 142 mEq/L (ref 135–145)
Total Bilirubin: 0.6 mg/dL (ref 0.2–1.2)
Total Protein: 6.9 g/dL (ref 6.0–8.3)

## 2022-11-19 LAB — LIPID PANEL
Cholesterol: 198 mg/dL (ref 0–200)
HDL: 39.7 mg/dL (ref >39.00)
LDL Cholesterol: 129 mg/dL — ABNORMAL HIGH (ref 0–99)
NonHDL: 158.21
Total CHOL/HDL Ratio: 5
Triglycerides: 146 mg/dL (ref 0.0–149.0)
VLDL: 29.2 mg/dL (ref 0.0–40.0)

## 2022-11-19 LAB — CBC WITH DIFFERENTIAL/PLATELET
Basophils Absolute: 0 10*3/uL (ref 0.0–0.1)
Basophils Relative: 0.8 % (ref 0.0–3.0)
Eosinophils Absolute: 0.2 10*3/uL (ref 0.0–0.7)
Eosinophils Relative: 3.2 % (ref 0.0–5.0)
HCT: 42.1 % (ref 39.0–52.0)
Hemoglobin: 14.4 g/dL (ref 13.0–17.0)
Lymphocytes Relative: 39.1 % (ref 12.0–46.0)
Lymphs Abs: 2.2 10*3/uL (ref 0.7–4.0)
MCHC: 34.2 g/dL (ref 30.0–36.0)
MCV: 90.9 fl (ref 78.0–100.0)
Monocytes Absolute: 0.6 10*3/uL (ref 0.1–1.0)
Monocytes Relative: 11.3 % (ref 3.0–12.0)
Neutro Abs: 2.6 10*3/uL (ref 1.4–7.7)
Neutrophils Relative %: 45.6 % (ref 43.0–77.0)
Platelets: 197 10*3/uL (ref 150.0–400.0)
RBC: 4.63 Mil/uL (ref 4.22–5.81)
RDW: 14.1 % (ref 11.5–15.5)
WBC: 5.6 10*3/uL (ref 4.0–10.5)

## 2022-11-19 LAB — HEMOGLOBIN A1C: Hgb A1c MFr Bld: 6.8 % — ABNORMAL HIGH (ref 4.6–6.5)

## 2022-11-19 LAB — PSA: PSA: 1.12 ng/mL (ref 0.10–4.00)

## 2022-11-19 NOTE — Assessment & Plan Note (Signed)
-   Tdap 2016 - Covid vax x 2, rec booster   - flu shot: declined  --CCS: No family history of colon cancer --Prostate cancer screening: Start screening today, DRE negative, check PSA.  No symptoms - Diet and exercise discussed -- Labs: CMP FLP CBC A1c PSA

## 2022-11-19 NOTE — Patient Instructions (Addendum)
Vaccines I recommend:  Covid booster Flu shot  Weight loss goal: 15 to 25 pounds over the next 6 to 8 months     GO TO THE LAB : Get the blood work     [place], [place] Come back for a physical exam in 1 year.  Sooner if needed

## 2022-11-19 NOTE — Assessment & Plan Note (Signed)
Here for CPX Prediabetes: Checking A1c, weight gain noted >> d/t neck pain has been inactive, has been overeating.  Patient plans to go back to healthier diet and exercise more.  Weight loss goal: 5 to 10% of current weight. Hyperlipidemia: On atorvastatin, checking labs GERD: Has developed some heartburn without dysphagia or odynophagia related to weight gain.  Recommend OTC Prilosec for now. Anxiety: Has a sporadic episodes of anxiety, takes Xanax very rarely. Neck pain: See last visit, saw Ortho, had an MRI (see report), did PT, pain decreased, neck ROM much better, arm numbness resolved. RTC 1 year.  Sooner depending on labs.

## 2022-11-19 NOTE — Progress Notes (Signed)
Subjective:    Patient ID: Laurence Compton, male    DOB: March 05, 1976, 47 y.o.   MRN: [id number]  DOS:  11/19/2022 Type of visit - description: CPX  Here for CPX. Was seen few months ago with neck pain, he is better. + Weight gain noted.  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)    Arthritis    C6 radiculopathy    H/O anxiety disorder    Hyperlipidemia    Palpitations 2007    saw cards, got a ECHO, told he was ok    Past Surgical History:  Procedure Laterality Date   TONSILLECTOMY AND ADENOIDECTOMY     Social History   Socioeconomic History   Marital status: Married    Spouse name: Not on file   Number of children: 2   Years of education: 16   Highest education level: Not on file  Occupational History   Occupation: Conservator, museum/gallery , physical work  Tobacco Use   Smoking status: Never   Smokeless tobacco: Never  Vaping Use   Vaping Use: Never used  Substance and Sexual Activity   Alcohol use: Yes    Alcohol/week: 7.0 standard drinks of alcohol    Types: 7 Cans of beer per week   Drug use: No    Comment: Previously used marijuana.   Sexual activity: Not on file  Other Topics Concern   Not on file  Social History Narrative   Lives w/ wife and 2 children  , 2009 and 2006   Right-handed.   Drinks 3 diet sodas per day.   Social Determinants of Health   Financial Resource Strain: Not on file  Food Insecurity: Not on file  Transportation Needs: Not on file  Physical Activity: Not on file  Stress: Not on file  Social Connections: Not on file  Intimate Partner Violence: Not on file    Current Outpatient Medications  Medication Instructions   ALPRAZolam (XANAX) 0.25 mg, Oral, At  bedtime PRN   atorvastatin (LIPITOR) 20 mg, Oral, Daily at bedtime   azelastine (ASTELIN) 0.1 % nasal spray 2 sprays, Each Nare, 2 times daily   cyclobenzaprine (FLEXERIL) 10 mg, Oral, 2 times daily PRN   fluticasone (FLONASE) 50 MCG/ACT nasal spray SHAKE LIQUID AND USE 2 SPRAYS IN EACH NOSTRIL DAILY AS NEEDED FOR ALLERGIES OR RHINITIS   ibuprofen (ADVIL) 400 mg, Oral, Every 6 hours PRN   valACYclovir (VALTREX) 1000 MG tablet TAKE 2 TABLETS A DAY FOR ONE DAY FOR EACH EPISODE OF COLD SORES.       Objective:   Physical Exam BP 124/82   Pulse 64   Temp 97.7 F (36.5 C) (Oral)   Resp 18   Ht '5\' 11"'$  (1.803 m)   Wt 268 lb 4 oz (121.7 kg)   SpO2 97%   BMI 37.41 kg/m  General: Well developed, NAD, BMI noted Neck: No  thyromegaly  HEENT:  Normocephalic . Face symmetric, atraumatic Lungs:  CTA B Normal respiratory effort, no intercostal retractions, no accessory muscle use. Heart: RRR,  no murmur.  Abdomen:  Not distended, soft, non-tender. No rebound or rigidity. DRE: Normal sphincter tone, brown  stools, prostate normal Lower extremities: no pretibial edema bilaterally  Skin: Exposed areas without rash. Not pale. Not jaundice Neurologic:  alert & oriented X3.  Speech normal, gait appropriate for age and unassisted Strength symmetric and appropriate for age.  Psych: Cognition and judgment appear intact.  Cooperative with normal attention span and concentration.  Behavior appropriate. No anxious or depressed appearing.     Assessment     Assessment: Prediabetes (a1c: 6.3   04/2017) Hyperlipidemia.  Anxiety  Seizure disorder? Saw neuro 12-2015, dx w/ SZ, Rx meds, EEG (-) 12-2015 Palpitations 2007, saw cards, got ECHO Fever blisters (daily valtrex)  PLAN: Here for CPX Prediabetes: Checking A1c, weight gain noted >> d/t neck pain has been inactive, has been overeating.  Patient plans to go back to healthier diet and exercise more.  Weight loss goal: 5 to 10% of current  weight. Hyperlipidemia: On atorvastatin, checking labs GERD: Has developed some heartburn without dysphagia or odynophagia related to weight gain.  Recommend OTC Prilosec for now. Anxiety: Has a sporadic episodes of anxiety, takes Xanax very rarely. Neck pain: See last visit, saw Ortho, had an MRI (see report), did PT, pain decreased, neck ROM much better, arm numbness resolved. RTC 1 year.  Sooner depending on labs.

## 2022-11-27 ENCOUNTER — Telehealth: Payer: Self-pay | Admitting: Internal Medicine

## 2022-11-27 NOTE — Telephone Encounter (Signed)
Left voicemail for patient to call back and schedule 4 month f/u with Dr. Larose Kells.

## 2023-01-18 ENCOUNTER — Encounter: Payer: Self-pay | Admitting: Internal Medicine

## 2023-01-19 MED ORDER — METFORMIN HCL ER 500 MG PO TB24: ORAL_TABLET | ORAL | 4 refills | Status: DC

## 2023-01-19 NOTE — Telephone Encounter (Signed)
Metformin ER 500: 1 tablet with breakfast for 1 week, then 2 tablets with breakfast. 75-monthsupply

## 2023-01-19 NOTE — Telephone Encounter (Signed)
Rx sent 

## 2023-01-19 NOTE — Addendum Note (Signed)
Addended byDamita Dunnings D on: 01/19/2023 08:17 AM   Modules accepted: Orders

## 2023-04-22 ENCOUNTER — Other Ambulatory Visit: Payer: Self-pay | Admitting: Internal Medicine

## 2023-05-07 ENCOUNTER — Ambulatory Visit: Payer: Federal, State, Local not specified - PPO | Admitting: Internal Medicine

## 2023-05-13 ENCOUNTER — Other Ambulatory Visit: Payer: Self-pay | Admitting: Internal Medicine

## 2023-06-08 ENCOUNTER — Ambulatory Visit: Payer: Federal, State, Local not specified - PPO | Admitting: Internal Medicine

## 2023-06-08 ENCOUNTER — Encounter: Payer: Self-pay | Admitting: Internal Medicine

## 2023-06-15 ENCOUNTER — Ambulatory Visit: Payer: Federal, State, Local not specified - PPO | Admitting: Internal Medicine

## 2023-06-15 ENCOUNTER — Encounter: Payer: Self-pay | Admitting: Internal Medicine

## 2023-06-15 VITALS — BP 124/72 | HR 78 | Temp 98.1°F | Resp 16 | Ht 71.0 in | Wt 264.2 lb

## 2023-06-15 DIAGNOSIS — E785 Hyperlipidemia, unspecified: Secondary | ICD-10-CM

## 2023-06-15 DIAGNOSIS — E119 Type 2 diabetes mellitus without complications: Secondary | ICD-10-CM

## 2023-06-15 MED ORDER — METFORMIN HCL ER 500 MG PO TB24: 500.0000 mg | ORAL_TABLET | Freq: Every day | ORAL | Status: DC

## 2023-06-15 NOTE — Progress Notes (Unsigned)
   Subjective:    Patient ID: Brandon Tyler, male    DOB: 07-Jan-1976, 47 y.o.   MRN: [id number]  DOS:  06/15/2023 Type of visit - description: f/u  Here for follow-up on diabetes and high cholesterol. Has not make much improvement on diet.  Not exercising much.  Wt Readings from Last 3 Encounters:  06/15/23 264 lb 4 oz (119.9 kg)  11/19/22 268 lb 4 oz (121.7 kg)  08/14/22 259 lb 4 oz (117.6 kg)    Review of Systems See above   Past Medical History:  Diagnosis Date   Allergic rhinitis    Arnold-Chiari malformation, type I ([facility])    Arthritis    C6 radiculopathy    H/O anxiety disorder    Hyperlipidemia    Palpitations 2007    saw cards, got a ECHO, told he was ok    Past Surgical History:  Procedure Laterality Date   TONSILLECTOMY AND ADENOIDECTOMY      Current Outpatient Medications  Medication Instructions   ALPRAZolam (XANAX) 0.25 mg, Oral, At bedtime PRN   atorvastatin (LIPITOR) 20 mg, Oral, Daily at bedtime   azelastine (ASTELIN) 0.1 % nasal spray 2 sprays, Each Nare, 2 times daily   cyclobenzaprine (FLEXERIL) 10 mg, Oral, 2 times daily PRN   fluticasone (FLONASE) 50 MCG/ACT nasal spray SHAKE LIQUID AND USE 2 SPRAYS IN EACH NOSTRIL DAILY AS NEEDED FOR ALLERGIES OR RHINITIS   ibuprofen (ADVIL) 400 mg, Oral, Every 6 hours PRN   metFORMIN (GLUCOPHAGE-XR) 1,000 mg, Oral, Daily with breakfast   valACYclovir (VALTREX) 1000 MG tablet TAKE 2 TABLETS A DAY FOR ONE DAY FOR EACH EPISODE OF COLD SORES.       Objective:   Physical Exam BP 124/72   Pulse 78   Temp 98.1 F (36.7 C) (Oral)   Resp 16   Ht 5\' 11"  (1.803 m)   Wt 264 lb 4 oz (119.9 kg)   SpO2 98%   BMI 36.86 kg/m  General:   Well developed, NAD, BMI noted. HEENT:  Normocephalic . Face symmetric, atraumatic Lower extremities: no pretibial edema bilaterally  Skin: Not pale. Not jaundice Neurologic:  alert & oriented X3.  Speech normal, gait appropriate for age and unassisted Psych--  Cognition and  judgment appear intact.  Cooperative with normal attention span and concentration.  Behavior appropriate. No anxious or depressed appearing.      Assessment    Assessment: DM: A1c reached diabetic levels 11-2022 Hyperlipidemia.  Anxiety  Seizure disorder? Saw neuro 12-2015, dx w/ SZ, Rx meds, EEG (-) 12-2015 Palpitations 2007, saw cards, got ECHO Fever blisters (daily valtrex)  PLAN: DM: Since the last visit, A1c because 6.8, become diabetic.  Since then he has started metformin, unable to tolerate more than 500 mg daily due to nausea. Has not make much progress regards to healthy diet, not exercising much. Previously was limited by neck pain but that is largely resolved. We had a long conversation about what A1c means, what role diet-exercise-generic play on DM. He verbalized understanding, we agreed to recheck A1c AST ALT.  Further advised for results.  He will be very interested on Victoza or something similar. Hyperlipidemia: Not well-controlled, on atorvastatin, check FLP RTC labs at his earliest convenience RTC 4 months

## 2023-06-15 NOTE — Patient Instructions (Addendum)
Vaccines I recommend: Covid booster Flu shot this fall    GO TO THE FRONT DESK, PLEASE SCHEDULE YOUR APPOINTMENTS Come back for   blood work fasting this week  Come back for a checkup in 4 months    Per our records you are due for your diabetic eye exam. Please contact your eye doctor to schedule an appointment. Please have them send copies of your office visit notes to [place]. Our fax number is [phone]. If you need a referral to an eye doctor please let us know.

## 2023-06-16 ENCOUNTER — Other Ambulatory Visit (INDEPENDENT_AMBULATORY_CARE_PROVIDER_SITE_OTHER): Payer: Federal, State, Local not specified - PPO

## 2023-06-16 DIAGNOSIS — E119 Type 2 diabetes mellitus without complications: Secondary | ICD-10-CM

## 2023-06-16 DIAGNOSIS — E785 Hyperlipidemia, unspecified: Secondary | ICD-10-CM

## 2023-06-16 LAB — HEMOGLOBIN A1C: Hgb A1c MFr Bld: 6 % (ref 4.6–6.5)

## 2023-06-16 LAB — LIPID PANEL
Cholesterol: 179 mg/dL (ref 0–200)
HDL: 40.9 mg/dL (ref >39.00)
LDL Cholesterol: 118 mg/dL — ABNORMAL HIGH (ref 0–99)
NonHDL: 138.48
Total CHOL/HDL Ratio: 4
Triglycerides: 101 mg/dL (ref 0.0–149.0)
VLDL: 20.2 mg/dL (ref 0.0–40.0)

## 2023-06-16 LAB — AST: AST: 22 U/L (ref 0–37)

## 2023-06-16 LAB — MICROALBUMIN / CREATININE URINE RATIO
Creatinine,U: 127.6 mg/dL
Microalb Creat Ratio: 0.5 mg/g (ref 0.0–30.0)
Microalb, Ur: <0.7 mg/dL (ref 0.0–1.9)

## 2023-06-16 LAB — ALT: ALT: 31 U/L (ref 0–53)

## 2023-06-16 NOTE — Assessment & Plan Note (Signed)
DM: Since the last visit, A1c because 6.8, become diabetic.  Since then he has started metformin, unable to tolerate more than 500 mg daily due to nausea. Has not make much progress regards to healthy diet, not exercising much. Previously was limited by neck pain but that is largely resolved. We had a long conversation about what A1c means, what role diet-exercise-generic play on DM. He verbalized understanding, we agreed to recheck A1c AST ALT.  Further advised for results.  He will be very interested on Victoza or something similar. Hyperlipidemia: Not well-controlled, on atorvastatin, check FLP RTC labs at his earliest convenience RTC 4 months

## 2023-06-17 MED ORDER — ATORVASTATIN CALCIUM 40 MG PO TABS: 40.0000 mg | ORAL_TABLET | Freq: Every day | ORAL | 1 refills | Status: DC

## 2023-06-17 NOTE — Addendum Note (Signed)
Addended byConrad Fayette[place] D on: 06/17/2023 10:22 AM   Modules accepted: Orders

## 2023-06-29 ENCOUNTER — Encounter: Payer: Self-pay | Admitting: Internal Medicine

## 2023-06-30 MED ORDER — OZEMPIC (0.25 OR 0.5 MG/DOSE) 2 MG/3ML ~~LOC~~ SOPN: 0.2500 mg | PEN_INJECTOR | SUBCUTANEOUS | 0 refills | Status: DC

## 2023-07-01 ENCOUNTER — Other Ambulatory Visit: Payer: Self-pay | Admitting: Internal Medicine

## 2023-07-08 ENCOUNTER — Encounter: Payer: Self-pay | Admitting: Internal Medicine

## 2023-08-05 ENCOUNTER — Other Ambulatory Visit: Payer: Self-pay | Admitting: Internal Medicine

## 2023-08-06 NOTE — Telephone Encounter (Signed)
Would you like Pt to increase to 0.5mg  weekly?

## 2023-08-06 NOTE — Telephone Encounter (Signed)
Rx sent 

## 2023-08-06 NOTE — Telephone Encounter (Signed)
Okay to increase to 0.5 mg weekly for a month.

## 2023-08-08 ENCOUNTER — Encounter: Payer: Self-pay | Admitting: Internal Medicine

## 2023-08-11 ENCOUNTER — Other Ambulatory Visit (HOSPITAL_COMMUNITY): Payer: Self-pay

## 2023-08-11 ENCOUNTER — Telehealth: Payer: Self-pay

## 2023-08-11 NOTE — Telephone Encounter (Signed)
Pharmacy Patient Advocate Encounter   Received notification from Physician's Office that prior authorization for [facility] is required/requested.   Insurance verification completed.   The patient is insured through [employer] .   Per test claim: PA required; PA submitted to [employer] [facility] via CoverMyMeds Key/confirmation #/EOC Key: [id number]  Status is pending

## 2023-08-13 ENCOUNTER — Other Ambulatory Visit (HOSPITAL_COMMUNITY): Payer: Self-pay

## 2023-08-13 NOTE — Telephone Encounter (Signed)
Pharmacy Patient Advocate Encounter  Received notification from [facility] that Prior Authorization for [facility] has been APPROVED from 9.25.24 to 9.25.25. Ran test claim, Rx was last filled 08/12/2023 and is not yet due/payable again.   This test claim was processed through [facility]- copay amounts may vary at other pharmacies due to pharmacy/plan contracts, or as the patient moves through the different stages of their insurance plan.   PA #/Case ID/Reference #: Key: [id number]

## 2023-08-13 NOTE — Telephone Encounter (Signed)
Noted  

## 2023-10-18 ENCOUNTER — Encounter: Payer: Self-pay | Admitting: Internal Medicine

## 2023-10-18 ENCOUNTER — Ambulatory Visit: Payer: Federal, State, Local not specified - PPO | Admitting: Internal Medicine

## 2023-10-18 VITALS — BP 128/84 | HR 68 | Temp 98.0°F | Resp 16 | Ht 71.0 in | Wt 248.5 lb

## 2023-10-18 DIAGNOSIS — Z7985 Long-term (current) use of injectable non-insulin antidiabetic drugs: Secondary | ICD-10-CM

## 2023-10-18 DIAGNOSIS — E119 Type 2 diabetes mellitus without complications: Secondary | ICD-10-CM

## 2023-10-18 DIAGNOSIS — Z7984 Long term (current) use of oral hypoglycemic drugs: Secondary | ICD-10-CM | POA: Diagnosis not present

## 2023-10-18 DIAGNOSIS — E785 Hyperlipidemia, unspecified: Secondary | ICD-10-CM | POA: Diagnosis not present

## 2023-10-18 DIAGNOSIS — Z114 Encounter for screening for human immunodeficiency virus [HIV]: Secondary | ICD-10-CM

## 2023-10-18 NOTE — Progress Notes (Unsigned)
   Subjective:    Patient ID: Brandon Tyler, male    DOB: Jul 05, 1976, 47 y.o.   MRN: [id number]  DOS:  10/18/2023 Type of visit - description: f/u  Chronic medical problems addressed.   Wt Readings from Last 3 Encounters:  10/18/23 248 lb 8 oz (112.7 kg)  06/15/23 264 lb 4 oz (119.9 kg)  11/19/22 268 lb 4 oz (121.7 kg)    Review of Systems See above   Past Medical History:  Diagnosis Date   Allergic rhinitis    Arnold-Chiari malformation, type I ([facility])    Arthritis    C6 radiculopathy    H/O anxiety disorder    Hyperlipidemia    Palpitations 2007    saw cards, got a ECHO, told he was ok    Past Surgical History:  Procedure Laterality Date   TONSILLECTOMY AND ADENOIDECTOMY      Current Outpatient Medications  Medication Instructions   ALPRAZolam (XANAX) 0.25 mg, Oral, At bedtime PRN   atorvastatin (LIPITOR) 40 mg, Oral, Daily at bedtime   azelastine (ASTELIN) 0.1 % nasal spray 2 sprays, Each Nare, 2 times daily   cyclobenzaprine (FLEXERIL) 10 mg, Oral, 2 times daily PRN   fluticasone (FLONASE) 50 MCG/ACT nasal spray SHAKE LIQUID AND USE 2 SPRAYS IN EACH NOSTRIL DAILY AS NEEDED FOR ALLERGIES OR RHINITIS   ibuprofen (ADVIL) 400 mg, Oral, Every 6 hours PRN   metFORMIN (GLUCOPHAGE-XR) 500 mg, Oral, Daily with breakfast   Ozempic (0.25 or 0.5 MG/DOSE) 0.5 mg, Subcutaneous, Weekly   valACYclovir (VALTREX) 1000 MG tablet TAKE 2 TABLETS A DAY FOR ONE DAY FOR EACH EPISODE OF COLD SORES.       Objective:   Physical Exam BP 128/84   Pulse 68   Temp 98 F (36.7 C) (Oral)   Resp 16   Ht 5\' 11"  (1.803 m)   Wt 248 lb 8 oz (112.7 kg)   SpO2 98%   BMI 34.66 kg/m  General:   Well developed, NAD, BMI noted. HEENT:  Normocephalic . Face symmetric, atraumatic DM foot exam. No edema, good pedal pulses, pinprick examination normal neurologic:  alert & oriented X3.  Speech normal, gait appropriate for age and unassisted Psych--  Cognition and judgment appear intact.   Cooperative with normal attention span and concentration.  Behavior appropriate. No anxious or depressed appearing.      Assessment     Assessment: DM: A1c reached diabetic levels 11-2022 Hyperlipidemia.  Anxiety  Seizure disorder? Saw neuro 12-2015, dx w/ SZ, Rx meds, EEG (-) 12-2015 Palpitations 2007, saw cards, got ECHO Fever blisters (daily valtrex)  PLAN: DM: Recently at patient request we switch metformin to Ozempic.  Tolerating well, no ambulatory CBGs, appetite is decreased and consequently he has cut down on portion sizes.  + Weight loss noted. Feet exam negative today Will come back fasting for blood work. I emphasized the need to not only eats smaller portions but also eat healthier food. As far as exercise, he is active at work. High cholesterol: Atorvastatin was increased to 40 mg daily, it created nausea, he is taking 20 mg twice daily and tolerating well.  Check labs. Preventive care: Recommend flu and COVID-vaccine.  Declined for now.  Check HIV at patient's request. RTC labs at his convenience RTC office visit 4 months

## 2023-10-18 NOTE — Patient Instructions (Addendum)
Vaccines I recommend: Covid booster Flu shot     Go to the front desk: Arrange for fasting blood work to be done in the next few days Next visit with me -- 4 months    STOP BY THE FIRST FLOOR:  get the XR     Per our records you are due for your diabetic eye exam. Please contact your eye doctor to schedule an appointment. Please have them send copies of your office visit notes to [place]. Our fax number is [phone]. If you need a referral to an eye doctor please let us know.

## 2023-10-19 NOTE — Assessment & Plan Note (Signed)
DM: Recently at patient request we switch metformin to Ozempic.  Tolerating well, no ambulatory CBGs, appetite is decreased and consequently he has cut down on portion sizes.  + Weight loss noted. Feet exam negative today Will come back fasting for blood work. I emphasized the need to not only eats smaller portions but also eat healthier food. As far as exercise, he is active at work. High cholesterol: Atorvastatin was increased to 40 mg daily, it created nausea, he is taking 20 mg twice daily and tolerating well.  Check labs. Preventive care: Recommend flu and COVID-vaccine.  Declined for now.  Check HIV at patient's request. RTC labs at his convenience RTC office visit 4 months

## 2023-10-20 ENCOUNTER — Encounter: Payer: Self-pay | Admitting: Internal Medicine

## 2023-10-21 ENCOUNTER — Other Ambulatory Visit (INDEPENDENT_AMBULATORY_CARE_PROVIDER_SITE_OTHER): Payer: Federal, State, Local not specified - PPO

## 2023-10-21 DIAGNOSIS — Z7985 Long-term (current) use of injectable non-insulin antidiabetic drugs: Secondary | ICD-10-CM | POA: Diagnosis not present

## 2023-10-21 DIAGNOSIS — E119 Type 2 diabetes mellitus without complications: Secondary | ICD-10-CM | POA: Diagnosis not present

## 2023-10-21 DIAGNOSIS — E785 Hyperlipidemia, unspecified: Secondary | ICD-10-CM | POA: Diagnosis not present

## 2023-10-21 DIAGNOSIS — Z114 Encounter for screening for human immunodeficiency virus [HIV]: Secondary | ICD-10-CM

## 2023-10-21 LAB — LIPID PANEL
Cholesterol: 143 mg/dL (ref 0–200)
HDL: 36.9 mg/dL — ABNORMAL LOW (ref >39.00)
LDL Cholesterol: 93 mg/dL (ref 0–99)
NonHDL: 106.41
Total CHOL/HDL Ratio: 4
Triglycerides: 67 mg/dL (ref 0.0–149.0)
VLDL: 13.4 mg/dL (ref 0.0–40.0)

## 2023-10-21 LAB — BASIC METABOLIC PANEL
BUN: 14 mg/dL (ref 6–23)
CO2: 30 meq/L (ref 19–32)
Calcium: 9.2 mg/dL (ref 8.4–10.5)
Chloride: 106 meq/L (ref 96–112)
Creatinine, Ser: 1.32 mg/dL (ref 0.40–1.50)
GFR: 65 mL/min (ref >60.00)
Glucose, Bld: 94 mg/dL (ref 70–99)
Potassium: 4.2 meq/L (ref 3.5–5.1)
Sodium: 141 meq/L (ref 135–145)

## 2023-10-21 LAB — AST: AST: 18 U/L (ref 0–37)

## 2023-10-21 LAB — HEMOGLOBIN A1C: Hgb A1c MFr Bld: 5.8 % (ref 4.6–6.5)

## 2023-10-21 LAB — HIV ANTIBODY (ROUTINE TESTING W REFLEX): HIV 1&2 Ab, 4th Generation: NONREACTIVE

## 2023-10-21 LAB — ALT: ALT: 27 U/L (ref 0–53)

## 2023-10-22 NOTE — Telephone Encounter (Signed)
Okay to give 2-day work note.  Let him know

## 2023-12-12 ENCOUNTER — Other Ambulatory Visit: Payer: Self-pay | Admitting: Internal Medicine

## 2023-12-28 DIAGNOSIS — M25571 Pain in right ankle and joints of right foot: Secondary | ICD-10-CM | POA: Diagnosis not present

## 2024-01-04 ENCOUNTER — Other Ambulatory Visit: Payer: Self-pay | Admitting: Internal Medicine

## 2024-02-18 ENCOUNTER — Encounter: Payer: Self-pay | Admitting: Internal Medicine

## 2024-02-18 ENCOUNTER — Ambulatory Visit: Payer: Federal, State, Local not specified - PPO | Admitting: Internal Medicine

## 2024-02-18 VITALS — BP 116/74 | HR 77 | Temp 98.3°F | Resp 18 | Ht 71.0 in | Wt 247.4 lb

## 2024-02-18 DIAGNOSIS — E119 Type 2 diabetes mellitus without complications: Secondary | ICD-10-CM | POA: Diagnosis not present

## 2024-02-18 DIAGNOSIS — Z7985 Long-term (current) use of injectable non-insulin antidiabetic drugs: Secondary | ICD-10-CM

## 2024-02-18 DIAGNOSIS — R194 Change in bowel habit: Secondary | ICD-10-CM

## 2024-02-18 DIAGNOSIS — Z1211 Encounter for screening for malignant neoplasm of colon: Secondary | ICD-10-CM

## 2024-02-18 DIAGNOSIS — E785 Hyperlipidemia, unspecified: Secondary | ICD-10-CM | POA: Diagnosis not present

## 2024-02-18 DIAGNOSIS — M545 Low back pain, unspecified: Secondary | ICD-10-CM

## 2024-02-18 MED ORDER — CYCLOBENZAPRINE HCL 10 MG PO TABS: 10.0000 mg | ORAL_TABLET | Freq: Two times a day (BID) | ORAL | 0 refills | Status: DC | PRN

## 2024-02-18 NOTE — Progress Notes (Signed)
 Subjective:    Patient ID: Brandon Tyler, male    DOB: 1976-02-25, 48 y.o.   MRN: [id number]  DOS:  02/18/2024 Type of visit - description: Routine follow-up.  Today we talk about diabetes. Normal amb CBGs. On Ozempic, has noted occasional nausea. Also when asked, admits to diarrhea for a while, at least twice a week the stools are watery.  Other times, he feels constipated and unable to had a normal bowel movement. Denies vomiting, no blood in the stools. He is not sure if symptoms started before or after he started Ozempic.  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  He took some ibuprofen and is slightly better.. No paresthesias. No fever or chills. No falls. No LUTS.   Wt Readings from Last 3 Encounters:  02/18/24 247 lb 6 oz (112.2 kg)  10/18/23 248 lb 8 oz (112.7 kg)  06/15/23 264 lb 4 oz (119.9 kg)     Review of Systems See above   Past Medical History:  Diagnosis Date   Allergic rhinitis    Arnold-Chiari malformation, type I ([facility])    Arthritis    C6 radiculopathy    H/O anxiety disorder    Hyperlipidemia    Palpitations 2007    saw cards, got a ECHO, told he was ok    Past Surgical History:  Procedure Laterality Date   TONSILLECTOMY AND ADENOIDECTOMY      Current Outpatient Medications  Medication Instructions   ALPRAZolam (XANAX) 0.25 mg, Oral, At bedtime PRN   atorvastatin (LIPITOR) 40 mg, Oral, Daily at bedtime   azelastine (ASTELIN) 0.1 % nasal spray 2 sprays, Each Nare, 2 times daily   cyclobenzaprine (FLEXERIL) 10 mg, Oral, 2 times daily PRN   fluticasone (FLONASE) 50 MCG/ACT nasal spray SHAKE LIQUID AND USE 2 SPRAYS IN EACH NOSTRIL DAILY AS NEEDED FOR ALLERGIES OR RHINITIS   Ozempic (0.25 or 0.5 MG/DOSE) 0.5 mg, Subcutaneous, Weekly   valACYclovir (VALTREX) 1000 MG tablet TAKE 2 TABLETS A DAY FOR ONE DAY FOR EACH EPISODE  OF COLD SORES.       Objective:   Physical Exam BP 116/74   Pulse 77   Temp 98.3 F (36.8 C) (Oral)   Resp 18   Ht 5\' 11"  (1.803 m)   Wt 247 lb 6 oz (112.2 kg)   SpO2 98%   BMI 34.50 kg/m  General:   Well developed, NAD, BMI noted. HEENT:  Normocephalic . Face symmetric, atraumatic Lungs:  CTA B Normal respiratory effort, no intercostal retractions, no accessory muscle use. Heart: RRR,  no murmur. MSK: No TTP at the lumbar spine or SI joints. Lower extremities: no pretibial edema bilaterally  Skin: Not pale. Not jaundice Neurologic:  alert & oriented X3.  Speech normal, gait and transferring: Limited due to back pain and changing positions. Motor: Symmetric. DTRs: Symmetric. Psych--  Cognition and judgment appear intact.  Cooperative with normal attention span and concentration.  Behavior appropriate. No anxious or depressed appearing.      Assessment   Assessment: DM: A1c reached diabetic levels 11-2022 Hyperlipidemia.  Anxiety  Seizure disorder? Saw neuro 12-2015, dx w/ SZ, Rx meds, EEG (-) 12-2015 Palpitations 2007, saw cards, got ECHO Fever blisters (daily valtrex)  PLAN: DM: Last A1c was 5.8.  On Ozempic 0.5 mg weekly.  He occasionally has nausea, he wonders if related to Ozempic. Has stopped losing  weight, admits that his appetite has increased lately despite taking Ozempic. Plan: Check BMP and A1c.  Further advise w/ results. Dyslipidemia: Last LDL 93.  Improving.  On atorvastatin Lumbalgia: Acute lumbalgia as described above, no red flags. Recommend conservative treatment with ibuprofen with GI precautions, RF Flexeril, heating pad, call if not gradually better. Change in bowel habits: He used to have regular BMs however lately is having either watery stools or constipation.  He is not sure if this started before or after he was prescribed Ozempic.  Nevertheless, this is a indication to further evaluation.  Refer to GI. RTC 3 to 4 months

## 2024-02-18 NOTE — Patient Instructions (Addendum)
 For back pain: ---IBUPROFEN (Advil or Motrin) 200 mg 2 tablets every 8 hours as needed for pain.  Always take it with food because may cause gastritis and ulcers.  If you notice nausea, stomach pain, change in the color of stools --->  Stop the medicine and let us know --- Flexeril, a muscle relaxant twice daily as needed --- Heating pad --- If no gradually better   GO TO THE LAB : Get the blood work     Please go to the front desk: Arrange for a follow-up in 3 to 4 months     Per our records you are due for your diabetic eye exam. Please contact your eye doctor to schedule an appointment. Please have them send copies of your office visit notes to [place]. Our fax number is [phone]. If you need a referral to an eye doctor please let us know.

## 2024-02-19 LAB — BASIC METABOLIC PANEL WITH GFR
BUN/Creatinine Ratio: 12 (calc) (ref 6–22)
BUN: 17 mg/dL (ref 7–25)
CO2: 25 mmol/L (ref 20–32)
Calcium: 9.3 mg/dL (ref 8.6–10.3)
Chloride: 110 mmol/L (ref 98–110)
Creat: 1.4 mg/dL — ABNORMAL HIGH (ref 0.60–1.29)
Glucose, Bld: 78 mg/dL (ref 65–99)
Potassium: 3.9 mmol/L (ref 3.5–5.3)
Sodium: 142 mmol/L (ref 135–146)
eGFR: 63 mL/min/{1.73_m2} (ref >=60)

## 2024-02-19 LAB — CBC WITH DIFFERENTIAL/PLATELET
Absolute Lymphocytes: 1764 {cells}/uL (ref 850–3900)
Absolute Monocytes: 534 {cells}/uL (ref 200–950)
Basophils Absolute: 39 {cells}/uL (ref 0–200)
Basophils Relative: 0.8 %
Eosinophils Absolute: 147 {cells}/uL (ref 15–500)
Eosinophils Relative: 3 %
HCT: 43.3 % (ref 38.5–50.0)
Hemoglobin: 14.7 g/dL (ref 13.2–17.1)
MCH: 30.8 pg (ref 27.0–33.0)
MCHC: 33.9 g/dL (ref 32.0–36.0)
MCV: 90.8 fL (ref 80.0–100.0)
MPV: 10.7 fL (ref 7.5–12.5)
Monocytes Relative: 10.9 %
Neutro Abs: 2416 {cells}/uL (ref 1500–7800)
Neutrophils Relative %: 49.3 %
Platelets: 199 10*3/uL (ref 140–400)
RBC: 4.77 10*6/uL (ref 4.20–5.80)
RDW: 13.1 % (ref 11.0–15.0)
Total Lymphocyte: 36 %
WBC: 4.9 10*3/uL (ref 3.8–10.8)

## 2024-02-19 LAB — HEMOGLOBIN A1C
Hgb A1c MFr Bld: 5.8 %{Hb} — ABNORMAL HIGH (ref <5.7)
Mean Plasma Glucose: 120 mg/dL
eAG (mmol/L): 6.6 mmol/L

## 2024-02-19 LAB — MICROALBUMIN / CREATININE URINE RATIO
Creatinine, Urine: 237 mg/dL (ref 20–320)
Microalb Creat Ratio: 2 mg/g{creat} (ref <30)
Microalb, Ur: 0.5 mg/dL

## 2024-02-20 NOTE — Assessment & Plan Note (Signed)
 DM: Last A1c was 5.8.  On Ozempic 0.5 mg weekly.  He occasionally has nausea, he wonders if related to Ozempic. Has stopped losing weight, admits that his appetite has increased lately despite taking Ozempic. Plan: Check BMP and A1c.  Further advise w/ results. Dyslipidemia: Last LDL 93.  Improving.  On atorvastatin Lumbalgia: Acute lumbalgia as described above, no red flags. Recommend conservative treatment with ibuprofen with GI precautions, RF Flexeril, heating pad, call if not gradually better. Change in bowel habits: He used to have regular BMs however lately is having either watery stools or constipation.  He is not sure if this started before or after he was prescribed Ozempic.  Nevertheless, this is a indication to further evaluation.  Refer to GI. RTC 3 to 4 months

## 2024-02-21 ENCOUNTER — Encounter: Payer: Self-pay | Admitting: Internal Medicine

## 2024-02-22 DIAGNOSIS — E119 Type 2 diabetes mellitus without complications: Secondary | ICD-10-CM

## 2024-04-29 ENCOUNTER — Other Ambulatory Visit: Payer: Self-pay | Admitting: Internal Medicine

## 2024-05-22 ENCOUNTER — Ambulatory Visit: Admitting: Internal Medicine

## 2024-05-22 ENCOUNTER — Encounter: Payer: Self-pay | Admitting: Internal Medicine

## 2024-05-22 VITALS — BP 126/68 | HR 67 | Temp 98.2°F | Resp 16 | Ht 71.0 in | Wt 249.4 lb

## 2024-05-22 DIAGNOSIS — E119 Type 2 diabetes mellitus without complications: Secondary | ICD-10-CM | POA: Diagnosis not present

## 2024-05-22 DIAGNOSIS — E785 Hyperlipidemia, unspecified: Secondary | ICD-10-CM | POA: Diagnosis not present

## 2024-05-22 DIAGNOSIS — Z0000 Encounter for general adult medical examination without abnormal findings: Secondary | ICD-10-CM

## 2024-05-22 DIAGNOSIS — M545 Low back pain, unspecified: Secondary | ICD-10-CM

## 2024-05-22 DIAGNOSIS — G8929 Other chronic pain: Secondary | ICD-10-CM

## 2024-05-22 NOTE — Progress Notes (Unsigned)
 Subjective:    Patient ID: Brandon Tyler, male    DOB: [birth year], 48 y.o.   MRN: [id number]  DOS:  05/22/2024 Type of visit - description: Here for CPX  Here for CPX Chronic medical problems addressed. Also, continue with bilateral low back pain without radiation. Happens every morning, moderate to severe, denies any lower extremities paresthesias.  Wt Readings from Last 3 Encounters:  05/22/24 249 lb 6 oz (113.1 kg)  02/18/24 247 lb 6 oz (112.2 kg)  10/18/23 248 lb 8 oz (112.7 kg)    Review of Systems See above   Past Medical History:  Diagnosis Date   Allergic rhinitis    Arnold-Chiari malformation, type I ([facility])    Arthritis    C6 radiculopathy    H/O anxiety disorder    Hyperlipidemia    Palpitations 2007    saw cards, got a ECHO, told he was ok    Past Surgical History:  Procedure Laterality Date   TONSILLECTOMY AND ADENOIDECTOMY     Social History   Social History Narrative   Lives w/ wife and 2 children  , 2009 and 2006   Right-handed.   Drinks 3 diet sodas per day.     Current Outpatient Medications  Medication Instructions   ALPRAZolam  (XANAX ) 0.25 mg, Oral, At bedtime PRN   atorvastatin  (LIPITOR) 40 mg, Oral, Daily at bedtime   azelastine  (ASTELIN ) 0.1 % nasal spray 2 sprays, Each Nare, 2 times daily   cyclobenzaprine  (FLEXERIL ) 10 mg, Oral, 2 times daily PRN   fluticasone  (FLONASE ) 50 MCG/ACT nasal spray SHAKE LIQUID AND USE 2 SPRAYS IN EACH NOSTRIL DAILY AS NEEDED FOR ALLERGIES OR RHINITIS   Ozempic  (0.25 or 0.5 MG/DOSE) 0.5 mg, Subcutaneous, Weekly   valACYclovir  (VALTREX ) 1000 MG tablet TAKE 2 TABLETS A DAY FOR ONE DAY FOR EACH EPISODE OF COLD SORES.       Objective:   Physical Exam BP 126/68   Pulse 67   Temp 98.2 F (36.8 C)   Resp 16   Ht 5' 11 (1.803 m)   Wt 249 lb 6 oz (113.1 kg)   SpO2 98%   BMI 34.78 kg/m  General: Well developed, NAD, BMI noted Neck: No  thyromegaly  HEENT:  Normocephalic . Face symmetric,  atraumatic Lungs:  CTA B Normal respiratory effort, no intercostal retractions, no accessory muscle use. Heart: RRR,  no murmur.  Abdomen:  Not distended, soft, non-tender. No rebound or rigidity.   Lower extremities: no pretibial edema bilaterally MSK: No TTP at the lumbar spine. Skin: Exposed areas without rash. Not pale. Not jaundice Neurologic:  alert & oriented X3.  Speech normal, gait appropriate for age and unassisted Strength symmetric, straight leg test negative, DTRs: Symmetrically decreased at the knees, normal ankle jerk Psych: Cognition and judgment appear intact.  Cooperative with normal attention span and concentration.  Behavior appropriate. No anxious or depressed appearing.     Assessment     Assessment: DM: A1c reached diabetic levels 11-2022 Hyperlipidemia.  Anxiety  Seizure disorder? Saw neuro 12-2015, dx w/ SZ, Rx meds, EEG (-) 12-2015 Palpitations 2007, saw cards, got ECHO Fever blisters (daily valtrex )  PLAN: Here for CPX -Tdap 2016 - Rec: Covid vax,  flu shot, PNM 20  --CCS: options d/w pt, prefers a cscope, patient will contact GI -Labs: AST ALT FLP A1c TSH PSA --Prostate cancer screening: No symptoms, check PSA - Diet and exercise discussed  Other issues: DM: Currently on Ozempic , doing well, only side effect  is episodic heartburn.  Rec small meals, small snacks.  Check A1c. Hyperlipidemia: On atorvastatin , checking labs. Lumbalgia: Getting more persistent lately, no red flag symptoms.  Recommend a round of PT, referral sent. Change in bowel habits:  symptoms triggered a GI referral but symptoms are essentially resolved. RTC 4 months

## 2024-05-22 NOTE — Patient Instructions (Addendum)
 Call gastroenterology to schedule your colonoscopy [phone]  Watch your diet closely.  We are referring you to physical therapy for your back pain  Vaccines are recommended: Flu shot every fall COVID booster Pneumonia shot (PNM 20)  Go to the front desk: Schedule a lab appointment at your earliest convenience.  Fasting. Schedule a follow-up with me in 4 months.

## 2024-05-23 ENCOUNTER — Encounter: Payer: Self-pay | Admitting: Internal Medicine

## 2024-05-23 NOTE — Assessment & Plan Note (Signed)
 Here for CPX -Tdap 2016 - Rec: Covid vax,  flu shot, PNM 20  --CCS: options d/w pt, prefers a cscope, patient will contact GI -Labs: AST ALT FLP A1c TSH PSA --Prostate cancer screening: No symptoms, check PSA - Diet and exercise discussed

## 2024-05-23 NOTE — Assessment & Plan Note (Signed)
 Here for CPX  Other issues: DM: Currently on Ozempic , doing well, only side effect is episodic heartburn.  Rec small meals, small snacks.  Check A1c. Hyperlipidemia: On atorvastatin , checking labs. Lumbalgia: Getting more persistent lately, no red flag symptoms.  Recommend a round of PT, referral sent. Change in bowel habits:  symptoms triggered a GI referral but symptoms are essentially resolved. RTC 4 months

## 2024-05-25 ENCOUNTER — Other Ambulatory Visit

## 2024-06-19 ENCOUNTER — Telehealth: Payer: Self-pay | Admitting: Pharmacy Technician

## 2024-06-19 ENCOUNTER — Other Ambulatory Visit (HOSPITAL_COMMUNITY): Payer: Self-pay

## 2024-06-19 NOTE — Telephone Encounter (Signed)
 Pharmacy Patient Advocate Encounter   Received notification from CoverMyMeds that prior authorization for Ozempic  (0.25 or 0.5 MG/DOSE) 2MG /3ML pen-injectors is due for renewal.   Insurance verification completed.   The patient is insured through [facility].  Action: PA required and submitted KEY/EOC/Request #: [id number] APPROVED from 06/19/24 to 06/19/25

## 2024-07-21 ENCOUNTER — Other Ambulatory Visit: Payer: Self-pay | Admitting: Internal Medicine

## 2024-08-28 ENCOUNTER — Encounter: Payer: Self-pay | Admitting: Internal Medicine

## 2024-08-28 ENCOUNTER — Other Ambulatory Visit (INDEPENDENT_AMBULATORY_CARE_PROVIDER_SITE_OTHER)

## 2024-08-28 DIAGNOSIS — E119 Type 2 diabetes mellitus without complications: Secondary | ICD-10-CM

## 2024-08-28 DIAGNOSIS — E785 Hyperlipidemia, unspecified: Secondary | ICD-10-CM

## 2024-08-28 DIAGNOSIS — Z125 Encounter for screening for malignant neoplasm of prostate: Secondary | ICD-10-CM

## 2024-08-28 LAB — BASIC METABOLIC PANEL WITH GFR
BUN: 15 mg/dL (ref 6–23)
CO2: 28 meq/L (ref 19–32)
Calcium: 9.3 mg/dL (ref 8.4–10.5)
Chloride: 104 meq/L (ref 96–112)
Creatinine, Ser: 1.29 mg/dL (ref 0.40–1.50)
GFR: 66.41 mL/min (ref >60.00)
Glucose, Bld: 106 mg/dL — ABNORMAL HIGH (ref 70–99)
Potassium: 4.2 meq/L (ref 3.5–5.1)
Sodium: 140 meq/L (ref 135–145)

## 2024-08-28 LAB — LIPID PANEL
Cholesterol: 156 mg/dL (ref 0–200)
HDL: 40.2 mg/dL (ref >39.00)
LDL Cholesterol: 101 mg/dL — ABNORMAL HIGH (ref 0–99)
NonHDL: 115.74
Total CHOL/HDL Ratio: 4
Triglycerides: 75 mg/dL (ref 0.0–149.0)
VLDL: 15 mg/dL (ref 0.0–40.0)

## 2024-08-28 LAB — HEMOGLOBIN A1C: Hgb A1c MFr Bld: 5.8 % (ref 4.6–6.5)

## 2024-08-28 LAB — AST: AST: 22 U/L (ref 0–37)

## 2024-08-28 LAB — TSH: TSH: 1.33 u[IU]/mL (ref 0.35–5.50)

## 2024-08-28 LAB — PSA: PSA: 1.31 ng/mL (ref 0.10–4.00)

## 2024-08-28 LAB — ALT: ALT: 36 U/L (ref 0–53)

## 2024-08-29 ENCOUNTER — Ambulatory Visit: Payer: Self-pay | Admitting: Internal Medicine

## 2024-09-08 DIAGNOSIS — H524 Presbyopia: Secondary | ICD-10-CM | POA: Diagnosis not present

## 2024-09-08 DIAGNOSIS — E119 Type 2 diabetes mellitus without complications: Secondary | ICD-10-CM | POA: Diagnosis not present

## 2024-09-08 LAB — HM DIABETES EYE EXAM

## 2024-09-11 ENCOUNTER — Encounter: Payer: Self-pay | Admitting: Internal Medicine

## 2024-10-16 ENCOUNTER — Other Ambulatory Visit: Payer: Self-pay | Admitting: Family

## 2024-10-20 MED ORDER — ATORVASTATIN CALCIUM 40 MG PO TABS: 40.0000 mg | ORAL_TABLET | Freq: Every day | ORAL | 1 refills | Status: DC

## 2024-10-20 NOTE — Addendum Note (Signed)
 Addended by: Alexias Margerum D on: 10/20/2024 04:04 PM   Modules accepted: Orders

## 2024-12-09 ENCOUNTER — Other Ambulatory Visit: Payer: Self-pay | Admitting: Internal Medicine
